# Patient Record
Sex: Male | Born: 1960 | Race: White | Hispanic: No | Marital: Married | Smoking: Never smoker
Health system: Southern US, Community
[De-identification: ages and names within clinical notes are randomized; demographics above are authoritative.]

## PROBLEM LIST (undated history)

## (undated) DIAGNOSIS — J45909 Unspecified asthma, uncomplicated: Secondary | ICD-10-CM

---

## 2020-04-16 ENCOUNTER — Emergency Department (HOSPITAL_COMMUNITY): Payer: HRSA Program

## 2020-04-16 ENCOUNTER — Telehealth (HOSPITAL_COMMUNITY): Payer: Self-pay

## 2020-04-16 ENCOUNTER — Inpatient Hospital Stay (HOSPITAL_COMMUNITY)
Admission: EM | Admit: 2020-04-16 | Discharge: 2020-04-22 | DRG: 177 | Disposition: A | Discharge status: Home / Self Care | Payer: HRSA Program | Attending: Internal Medicine | Admitting: Internal Medicine

## 2020-04-16 ENCOUNTER — Encounter (HOSPITAL_COMMUNITY): Payer: Self-pay

## 2020-04-16 DIAGNOSIS — J189 Pneumonia, unspecified organism: Secondary | ICD-10-CM | POA: Diagnosis present

## 2020-04-16 DIAGNOSIS — U071 COVID-19: Secondary | ICD-10-CM | POA: Diagnosis present

## 2020-04-16 DIAGNOSIS — D696 Thrombocytopenia, unspecified: Secondary | ICD-10-CM | POA: Diagnosis present

## 2020-04-16 DIAGNOSIS — I4510 Unspecified right bundle-branch block: Secondary | ICD-10-CM | POA: Diagnosis present

## 2020-04-16 DIAGNOSIS — J1282 Pneumonia due to coronavirus disease 2019: Secondary | ICD-10-CM | POA: Diagnosis present

## 2020-04-16 DIAGNOSIS — I10 Essential (primary) hypertension: Secondary | ICD-10-CM | POA: Diagnosis present

## 2020-04-16 DIAGNOSIS — R7982 Elevated C-reactive protein (CRP): Secondary | ICD-10-CM | POA: Diagnosis present

## 2020-04-16 DIAGNOSIS — J9601 Acute respiratory failure with hypoxia: Secondary | ICD-10-CM | POA: Diagnosis present

## 2020-04-16 DIAGNOSIS — D649 Anemia, unspecified: Secondary | ICD-10-CM | POA: Diagnosis present

## 2020-04-16 DIAGNOSIS — J45909 Unspecified asthma, uncomplicated: Secondary | ICD-10-CM | POA: Diagnosis present

## 2020-04-16 DIAGNOSIS — R0602 Shortness of breath: Secondary | ICD-10-CM

## 2020-04-16 HISTORY — DX: Unspecified asthma, uncomplicated: J45.909

## 2020-04-16 LAB — CBC
HCT: 39.3 % (ref 39.0–52.0)
Hemoglobin: 12.8 g/dL — ABNORMAL LOW (ref 13.0–17.0)
MCH: 28.3 pg (ref 26.0–34.0)
MCHC: 32.6 g/dL (ref 30.0–36.0)
MCV: 86.8 fL (ref 80.0–100.0)
Platelets: 138 10*3/uL — ABNORMAL LOW (ref 150–400)
RBC: 4.53 MIL/uL (ref 4.22–5.81)
RDW: 12.9 % (ref 11.5–15.5)
WBC: 6.9 10*3/uL (ref 4.0–10.5)
nRBC: 0 % (ref 0.0–0.2)

## 2020-04-16 LAB — FIBRINOGEN: Fibrinogen: 746 mg/dL — ABNORMAL HIGH (ref 210–475)

## 2020-04-16 LAB — BASIC METABOLIC PANEL
Anion gap: 12 (ref 5–15)
BUN: 16 mg/dL (ref 6–20)
CO2: 29 mmol/L (ref 22–32)
Calcium: 8.6 mg/dL — ABNORMAL LOW (ref 8.9–10.3)
Chloride: 98 mmol/L (ref 98–111)
Creatinine, Ser: 0.94 mg/dL (ref 0.61–1.24)
GFR, Estimated: >60 mL/min (ref >60)
Glucose, Bld: 111 mg/dL — ABNORMAL HIGH (ref 70–99)
Potassium: 3.1 mmol/L — ABNORMAL LOW (ref 3.5–5.1)
Sodium: 139 mmol/L (ref 135–145)

## 2020-04-16 LAB — D-DIMER, QUANTITATIVE: D-Dimer, Quant: 0.49 ug/mL-FEU (ref 0.00–0.50)

## 2020-04-16 LAB — POC SARS CORONAVIRUS 2 AG -  ED: SARS Coronavirus 2 Ag: NEGATIVE

## 2020-04-16 LAB — C-REACTIVE PROTEIN: CRP: 26.5 mg/dL — ABNORMAL HIGH (ref <1.0)

## 2020-04-16 LAB — FERRITIN: Ferritin: 332 ng/mL (ref 24–336)

## 2020-04-16 LAB — LACTATE DEHYDROGENASE: LDH: 333 U/L — ABNORMAL HIGH (ref 98–192)

## 2020-04-16 LAB — LACTIC ACID, PLASMA: Lactic Acid, Venous: 1.1 mmol/L (ref 0.5–1.9)

## 2020-04-16 MED ORDER — ALBUTEROL SULFATE HFA 108 (90 BASE) MCG/ACT IN AERS
2.0000 | INHALATION_SPRAY | Freq: Once | RESPIRATORY_TRACT | Status: AC
  Administered 2020-04-16: 2 via RESPIRATORY_TRACT
  Filled 2020-04-16: qty 6.7

## 2020-04-16 MED ORDER — DEXAMETHASONE SODIUM PHOSPHATE 4 MG/ML IJ SOLN
4.0000 mg | Freq: Once | INTRAMUSCULAR | Status: AC
  Administered 2020-04-16: 4 mg via INTRAVENOUS
  Filled 2020-04-16: qty 1

## 2020-04-16 NOTE — ED Triage Notes (Signed)
Pt states he was diagnosed with covid on Tuesday, vaccinated, now SOB and hypoxic, 85% on RA, placed on 3L, fevers

## 2020-04-16 NOTE — ED Notes (Signed)
Oxygen increased to 6L, oxygen 92-93%

## 2020-04-16 NOTE — Telephone Encounter (Signed)
Called to Discuss with patient about Covid symptoms and the use of the monoclonal antibody infusion for those with mild to moderate Covid symptoms and at a high risk of hospitalization.     Pt appears to qualify for this infusion due to co-morbid conditions and/or a member of an at-risk group in accordance with the FDA Emergency Use Authorization.    Pt instructed RN that I could speak to his wife as he was short of breath. Pt's wife informed RN that she and her husband both had appointments today for a monoclonal antibody infusion but her husband was turned away because his oxygen was low, she stated she did receive the infusion. She stated his oxygen was 83% and he had a history of asthma. RN informed pt's wife that he would not qualify for infusion based on his symptoms and he needed to go to the ED to be evaluated immediately. She stated that she bought an "oxygen machine for him and he was at 90L". RN informed her to take the patient to the ED for evaluation. Pt's wife understands without assistance.

## 2020-04-16 NOTE — ED Provider Notes (Signed)
[facility] EMERGENCY DEPARTMENT Provider Note   CSN: [id number] Arrival date & time: 04/16/20  1857     History Chief Complaint  Patient presents with  . Covid Positive    Erik Guerra is a 59 y.o. male history of asthma, presenting with COVID and shortness of breath and hypoxia. Patient actually received the [facility] vaccine about a month ago.  Is [place].  He states that he has been there and been exposed to multiple people during that time.  Patient states that he started coughing several days ago.  His wife and himself tested positive yesterday with Covid.  He states that he has worsening shortness of breath.  He initially went to get the monoclonal antibody but it was noted to be hypoxic at 83% and was told to come to the ED.  Patient was noted to be 85% on room air.  The history is provided by the patient.       Past Medical History:  Diagnosis Date  . Asthma     There are no problems to display for this patient.   History reviewed. No pertinent surgical history.     No family history on file.  Social History   Tobacco Use  . Smoking status: Not on file  Substance Use Topics  . Alcohol use: Not on file  . Drug use: Not on file    Home Medications Prior to Admission medications   Not on File    Allergies    Patient has no known allergies.  Review of Systems   Review of Systems  Respiratory: Positive for cough and shortness of breath.   All other systems reviewed and are negative.   Physical Exam Updated Vital Signs BP (!) 163/82 (BP Location: Right Arm)   Pulse 80   Temp 98 F (36.7 C) (Oral)   Resp 20   SpO2 92%   Physical Exam Vitals and nursing note reviewed.  Constitutional:      Comments: Tachypneic.  HENT:     Head: Normocephalic.     Nose: Nose normal.     Mouth/Throat:     Mouth: Mucous membranes are moist.  Eyes:     Extraocular Movements: Extraocular movements intact.      Pupils: Pupils are equal, round, and reactive to light.  Cardiovascular:     Rate and Rhythm: Normal rate and regular rhythm.     Pulses: Normal pulses.  Pulmonary:     Comments: Tachypneic, dry crackles bilaterally. Abdominal:     General: Abdomen is flat.     Palpations: Abdomen is soft.  Musculoskeletal:        General: Normal range of motion.     Cervical back: Normal range of motion.  Skin:    General: Skin is warm.  Neurological:     General: No focal deficit present.     Mental Status: He is oriented to person, place, and time.  Psychiatric:        Mood and Affect: Mood normal.        Behavior: Behavior normal.     ED Results / Procedures / Treatments   Labs (all labs ordered are listed, but only abnormal results are displayed) Labs Reviewed  CBC - Abnormal; Notable for the following components:      Result Value   Hemoglobin 12.8 (*)    Platelets 138 (*)    All other components within normal limits  BASIC METABOLIC PANEL -  Abnormal; Notable for the following components:   Potassium 3.1 (*)    Glucose, Bld 111 (*)    Calcium 8.6 (*)    All other components within normal limits  CULTURE, BLOOD (ROUTINE X 2)  CULTURE, BLOOD (ROUTINE X 2)  LACTIC ACID, PLASMA  LACTIC ACID, PLASMA  D-DIMER, QUANTITATIVE (NOT AT [facility])  PROCALCITONIN  LACTATE DEHYDROGENASE  FERRITIN  TRIGLYCERIDES  FIBRINOGEN  C-REACTIVE PROTEIN  POC SARS CORONAVIRUS 2 AG -  ED    EKG EKG Interpretation  Date/Time:  Wednesday April 16 2020 20:58:05 EDT Ventricular Rate:  77 PR Interval:  182 QRS Duration: 166 QT Interval:  424 QTC Calculation: 479 R Axis:   80 Text Interpretation: Normal sinus rhythm Right bundle branch block Abnormal ECG No previous ECGs available Confirmed by Richardean Canal [phone]) on 04/16/2020 9:29:14 PM   Radiology DG Chest Portable 1 View  Result Date: 04/16/2020 CLINICAL DATA:  Shortness of breath COVID positive EXAM: PORTABLE CHEST 1 VIEW COMPARISON:  None.  FINDINGS: The heart size and mediastinal contours mildly enlarged. There is diffusely increased interstitial markings seen throughout both lungs. There is patchy airspace opacity seen at the left lung base and periphery of the right lung base. No pleural effusion. IMPRESSION: Diffusely increased interstitial and patchy airspace opacities, likely consistent with multifocal pneumonia. Electronically Signed   By: Jonna Clark M.D.   On: 04/16/2020 21:36    Procedures Procedures (including critical care time)  CRITICAL CARE Performed by: Richardean Canal   Total critical care time: 30 minutes  Critical care time was exclusive of separately billable procedures and treating other patients.  Critical care was necessary to treat or prevent imminent or life-threatening deterioration.  Critical care was time spent personally by me on the following activities: development of treatment plan with patient and/or surrogate as well as nursing, discussions with consultants, evaluation of patient's response to treatment, examination of patient, obtaining history from patient or surrogate, ordering and performing treatments and interventions, ordering and review of laboratory studies, ordering and review of radiographic studies, pulse oximetry and re-evaluation of patient's condition.   Medications Ordered in ED Medications  dexamethasone (DECADRON) injection 4 mg (has no administration in time range)  albuterol (VENTOLIN HFA) 108 (90 Base) MCG/ACT inhaler 2 puff (has no administration in time range)    ED Course  I have reviewed the triage vital signs and the nursing notes.  Pertinent labs & imaging results that were available during my care of the patient were reviewed by me and considered in my medical decision making (see chart for details).    MDM Rules/Calculators/A&P                         Erik Guerra is a 59 y.o. male here with shortness of breath.  Patient was diagnosed with Covid yesterday.   Patient is hypoxic at 85%.  Since his Covid test is not in our system, I plan to repeat Covid test and get Covid preadmission labs and chest x-ray.  10:36 PM CXR showed COVID. CBC, BMP, unremarkable. COVID test pending. Will admit for hypoxia from COVID     Final Clinical Impression(s) / ED Diagnoses Final diagnoses:  None    Rx / DC Orders ED Discharge Orders    None       Charlynne Pander, MD 04/16/20 2237

## 2020-04-17 ENCOUNTER — Other Ambulatory Visit: Payer: Self-pay

## 2020-04-17 ENCOUNTER — Encounter (HOSPITAL_COMMUNITY): Payer: Self-pay | Admitting: Internal Medicine

## 2020-04-17 DIAGNOSIS — J1282 Pneumonia due to coronavirus disease 2019: Secondary | ICD-10-CM

## 2020-04-17 DIAGNOSIS — J189 Pneumonia, unspecified organism: Secondary | ICD-10-CM | POA: Diagnosis present

## 2020-04-17 DIAGNOSIS — I10 Essential (primary) hypertension: Secondary | ICD-10-CM

## 2020-04-17 DIAGNOSIS — U071 COVID-19: Principal | ICD-10-CM

## 2020-04-17 LAB — FERRITIN: Ferritin: 365 ng/mL — ABNORMAL HIGH (ref 24–336)

## 2020-04-17 LAB — BASIC METABOLIC PANEL
Anion gap: 14 (ref 5–15)
BUN: 12 mg/dL (ref 6–20)
CO2: 28 mmol/L (ref 22–32)
Calcium: 8.5 mg/dL — ABNORMAL LOW (ref 8.9–10.3)
Chloride: 98 mmol/L (ref 98–111)
Creatinine, Ser: 0.75 mg/dL (ref 0.61–1.24)
GFR, Estimated: >60 mL/min (ref >60)
Glucose, Bld: 146 mg/dL — ABNORMAL HIGH (ref 70–99)
Potassium: 3.7 mmol/L (ref 3.5–5.1)
Sodium: 140 mmol/L (ref 135–145)

## 2020-04-17 LAB — MAGNESIUM: Magnesium: 2.3 mg/dL (ref 1.7–2.4)

## 2020-04-17 LAB — C-REACTIVE PROTEIN: CRP: 29.9 mg/dL — ABNORMAL HIGH (ref <1.0)

## 2020-04-17 LAB — IRON AND TIBC
Iron: 16 ug/dL — ABNORMAL LOW (ref 45–182)
Saturation Ratios: 6 % — ABNORMAL LOW (ref 17.9–39.5)
TIBC: 269 ug/dL (ref 250–450)
UIBC: 253 ug/dL

## 2020-04-17 LAB — CBC
HCT: 38.7 % — ABNORMAL LOW (ref 39.0–52.0)
Hemoglobin: 12.8 g/dL — ABNORMAL LOW (ref 13.0–17.0)
MCH: 28.8 pg (ref 26.0–34.0)
MCHC: 33.1 g/dL (ref 30.0–36.0)
MCV: 87.2 fL (ref 80.0–100.0)
Platelets: 147 10*3/uL — ABNORMAL LOW (ref 150–400)
RBC: 4.44 MIL/uL (ref 4.22–5.81)
RDW: 13 % (ref 11.5–15.5)
WBC: 6.7 10*3/uL (ref 4.0–10.5)
nRBC: 0 % (ref 0.0–0.2)

## 2020-04-17 LAB — RESPIRATORY PANEL BY RT PCR (FLU A&B, COVID)
Influenza A by PCR: NEGATIVE
Influenza B by PCR: NEGATIVE
SARS Coronavirus 2 by RT PCR: POSITIVE — AB

## 2020-04-17 LAB — VITAMIN B12: Vitamin B-12: 809 pg/mL (ref 180–914)

## 2020-04-17 LAB — HIV ANTIBODY (ROUTINE TESTING W REFLEX): HIV Screen 4th Generation wRfx: NONREACTIVE

## 2020-04-17 LAB — CREATININE, SERUM
Creatinine, Ser: 0.78 mg/dL (ref 0.61–1.24)
GFR, Estimated: >60 mL/min (ref >60)

## 2020-04-17 LAB — LACTIC ACID, PLASMA: Lactic Acid, Venous: 1 mmol/L (ref 0.5–1.9)

## 2020-04-17 LAB — PROCALCITONIN: Procalcitonin: 0.13 ng/mL

## 2020-04-17 LAB — FOLATE: Folate: 6.7 ng/mL (ref >5.9)

## 2020-04-17 LAB — RETICULOCYTES
Immature Retic Fract: 7.2 % (ref 2.3–15.9)
RBC.: 4.57 MIL/uL (ref 4.22–5.81)
Retic Count, Absolute: 22.4 10*3/uL (ref 19.0–186.0)
Retic Ct Pct: 0.5 % (ref 0.4–3.1)

## 2020-04-17 LAB — D-DIMER, QUANTITATIVE: D-Dimer, Quant: 0.61 ug/mL-FEU — ABNORMAL HIGH (ref 0.00–0.50)

## 2020-04-17 LAB — STREP PNEUMONIAE URINARY ANTIGEN: Strep Pneumo Urinary Antigen: NEGATIVE

## 2020-04-17 LAB — BRAIN NATRIURETIC PEPTIDE: B Natriuretic Peptide: 62.6 pg/mL (ref 0.0–100.0)

## 2020-04-17 LAB — TRIGLYCERIDES: Triglycerides: 81 mg/dL (ref <150)

## 2020-04-17 MED ORDER — HYDROCOD POLST-CPM POLST ER 10-8 MG/5ML PO SUER
5.0000 mL | Freq: Two times a day (BID) | ORAL | Status: DC | PRN
  Filled 2020-04-17: qty 5

## 2020-04-17 MED ORDER — TOCILIZUMAB 400 MG/20ML IV SOLN
800.0000 mg | Freq: Once | INTRAVENOUS | Status: AC
  Administered 2020-04-17: 800 mg via INTRAVENOUS
  Filled 2020-04-17: qty 40

## 2020-04-17 MED ORDER — SODIUM CHLORIDE 0.9 % IV SOLN
500.0000 mg | INTRAVENOUS | Status: DC
  Administered 2020-04-17: 500 mg via INTRAVENOUS
  Filled 2020-04-17: qty 500

## 2020-04-17 MED ORDER — POTASSIUM CHLORIDE CRYS ER 20 MEQ PO TBCR
40.0000 meq | EXTENDED_RELEASE_TABLET | Freq: Once | ORAL | Status: AC
  Administered 2020-04-17: 40 meq via ORAL
  Filled 2020-04-17: qty 2

## 2020-04-17 MED ORDER — SODIUM CHLORIDE 0.9 % IV SOLN
2.0000 g | Freq: Once | INTRAVENOUS | Status: AC
  Administered 2020-04-17: 2 g via INTRAVENOUS
  Filled 2020-04-17: qty 20

## 2020-04-17 MED ORDER — ACETAMINOPHEN 325 MG PO TABS
650.0000 mg | ORAL_TABLET | Freq: Four times a day (QID) | ORAL | Status: DC | PRN
  Administered 2020-04-17 – 2020-04-19 (×6): 650 mg via ORAL
  Filled 2020-04-17 (×7): qty 2

## 2020-04-17 MED ORDER — PNEUMOCOCCAL VAC POLYVALENT 25 MCG/0.5ML IJ INJ
0.5000 mL | INJECTION | INTRAMUSCULAR | Status: DC
  Filled 2020-04-17: qty 0.5

## 2020-04-17 MED ORDER — ALBUTEROL SULFATE HFA 108 (90 BASE) MCG/ACT IN AERS
2.0000 | INHALATION_SPRAY | RESPIRATORY_TRACT | Status: DC
  Administered 2020-04-17 – 2020-04-20 (×19): 2 via RESPIRATORY_TRACT
  Filled 2020-04-17: qty 6.7

## 2020-04-17 MED ORDER — HYDRALAZINE HCL 20 MG/ML IJ SOLN
10.0000 mg | INTRAMUSCULAR | Status: DC | PRN
  Administered 2020-04-18 – 2020-04-19 (×2): 10 mg via INTRAVENOUS
  Filled 2020-04-17 (×2): qty 1

## 2020-04-17 MED ORDER — MELATONIN 5 MG PO TABS
5.0000 mg | ORAL_TABLET | Freq: Every evening | ORAL | Status: DC | PRN
  Administered 2020-04-17 – 2020-04-21 (×4): 5 mg via ORAL
  Filled 2020-04-17 (×8): qty 1

## 2020-04-17 MED ORDER — GUAIFENESIN-DM 100-10 MG/5ML PO SYRP
10.0000 mL | ORAL_SOLUTION | ORAL | Status: DC | PRN
  Administered 2020-04-17: 10 mL via ORAL
  Filled 2020-04-17: qty 10

## 2020-04-17 MED ORDER — SODIUM CHLORIDE 0.9 % IV SOLN: 2.0000 g | INTRAVENOUS | Status: DC

## 2020-04-17 MED ORDER — ENOXAPARIN SODIUM 40 MG/0.4ML ~~LOC~~ SOLN
40.0000 mg | SUBCUTANEOUS | Status: DC
  Administered 2020-04-17: 40 mg via SUBCUTANEOUS
  Filled 2020-04-17: qty 0.4

## 2020-04-17 MED ORDER — METHYLPREDNISOLONE SODIUM SUCC 40 MG IJ SOLR: 40.0000 mg | Freq: Two times a day (BID) | INTRAMUSCULAR | Status: DC

## 2020-04-17 MED ORDER — METHYLPREDNISOLONE SODIUM SUCC 125 MG IJ SOLR
60.0000 mg | Freq: Two times a day (BID) | INTRAMUSCULAR | Status: DC
  Administered 2020-04-17 – 2020-04-21 (×8): 60 mg via INTRAVENOUS
  Filled 2020-04-17 (×8): qty 2

## 2020-04-17 MED ORDER — SODIUM CHLORIDE 0.9 % IV SOLN
100.0000 mg | Freq: Every day | INTRAVENOUS | Status: AC
  Administered 2020-04-18 – 2020-04-21 (×4): 100 mg via INTRAVENOUS
  Filled 2020-04-17 (×4): qty 20

## 2020-04-17 MED ORDER — SODIUM CHLORIDE 0.9 % IV SOLN
200.0000 mg | Freq: Once | INTRAVENOUS | Status: AC
  Administered 2020-04-17: 200 mg via INTRAVENOUS
  Filled 2020-04-17: qty 40

## 2020-04-17 MED ORDER — ASCORBIC ACID 500 MG PO TABS
500.0000 mg | ORAL_TABLET | Freq: Every day | ORAL | Status: DC
  Administered 2020-04-17 – 2020-04-22 (×6): 500 mg via ORAL
  Filled 2020-04-17 (×6): qty 1

## 2020-04-17 MED ORDER — AMLODIPINE BESYLATE 10 MG PO TABS
10.0000 mg | ORAL_TABLET | Freq: Every day | ORAL | Status: DC
  Administered 2020-04-17 – 2020-04-22 (×6): 10 mg via ORAL
  Filled 2020-04-17 (×6): qty 1

## 2020-04-17 MED ORDER — ACETAMINOPHEN 650 MG RE SUPP: 650.0000 mg | Freq: Four times a day (QID) | RECTAL | Status: DC | PRN

## 2020-04-17 MED ORDER — ZINC SULFATE 220 (50 ZN) MG PO CAPS
220.0000 mg | ORAL_CAPSULE | Freq: Every day | ORAL | Status: DC
  Administered 2020-04-17 – 2020-04-22 (×6): 220 mg via ORAL
  Filled 2020-04-17 (×6): qty 1

## 2020-04-17 NOTE — ED Notes (Signed)
Patient transported to floor.

## 2020-04-17 NOTE — Progress Notes (Signed)
PROGRESS NOTE                                                                                                                                                                                                             Patient Demographics:    Erik Guerra, is a 59 y.o. male, DOB - 02/11/1961, IZT:[id number]  Outpatient Primary MD for the patient is Patient, No Pcp Per    LOS - 1  Admit date - 04/16/2020    Chief Complaint  Patient presents with  . Covid Positive       Brief Narrative (HPI from H&P)  - Erik Guerra is a 59 y.o. male with history of asthma presently not on any medication presents to the ER because of shortness of breath.  Patient states he started developing symptoms of fatigue weakness and fever and chills about 10 days ago when he attended a fair, he was diagnosed with acute hypoxic respiratory failure due to COVID-19 pneumonia and admitted to the hospital.  Unfortunately he is not vaccinated for COVID-19.   Subjective:    Erik Guerra today has, No headache, No chest pain, No abdominal pain - No Nausea, No new weakness tingling or numbness, ++ SOB.   Assessment  & Plan :      1. Acute Hypoxic Resp. Failure due to Acute Covid 19 Viral Pneumonitis during the ongoing 2020 Covid 19 Pandemic - he is unfortunately unvaccinated and has incurred moderate to severe parenchymal lung injury, he is already on steroids and Remdesivir but getting more hypoxic despite that, will continue with Actemra administration.  He has consented for Actemra use.  Stop antibiotics as I do not suspect a bacterial component to his infection, no productive cough, no leukocytosis and no focal infiltrate.  Encouraged the patient to sit up in chair in the daytime use I-S and flutter valve for pulmonary toiletry and then prone in bed when at night.  Will advance activity and titrate down oxygen as possible.  Actemra/Baricitinib   off label use - patient was told that if COVID-19 pneumonitis gets worse we might potentially use Actemra off label, patient denies any known history of active diverticulitis, tuberculosis or hepatitis, understands the risks and benefits and wants to proceed with Actemra treatment if required.     SpO2: 92 % O2 Flow Rate (L/min): 5 L/min  Recent Labs  Lab 04/16/20 2052 04/16/20 2247 04/16/20 2248 04/17/20 0212 04/17/20 0303 04/17/20 0314  WBC 6.9  --   --  6.7  --   --   HGB 12.8*  --   --  12.8*  --   --   HCT 39.3  --   --  38.7*  --   --   PLT 138*  --   --  147*  --   --   CRP  --   --  26.5*  --   --   --   DDIMER  --   --  0.49  --   --   --   PROCALCITON  --   --  0.13  --   --   --   LATICACIDVEN  --  1.1  --   --   --  1.0  SARSCOV2NAA  --   --   --   --  POSITIVE*  --      2.  Undiagnosed hypertension.  Add Norvasc along with as needed hydralazine.  3. Mild anemia and thrombocytopenia.  Could be due to viral infection.  PCP to monitor post discharge and do age-appropriate outpatient work-up if needed.     Condition - Extremely Guarded  Family Communication  :  Wife Elita Quickam [phone] on 04/17/20 @ 10/24 am - message left  Code Status : Full  Consults  : None  Procedures  :  None  PUD Prophylaxis : None  Disposition Plan  :    Status is: Inpatient  Remains inpatient appropriate because:IV treatments appropriate due to intensity of illness or inability to take PO   Dispo: The patient is from: Home              Anticipated d/c is to: Home              Anticipated d/c date is: > 3 days              Patient currently is not medically stable to d/c.   DVT Prophylaxis  :  Lovenox    Lab Results  Component Value Date   PLT 147 (L) 04/17/2020    Diet :  Diet Order            Diet regular Room service appropriate? Yes; Fluid consistency: Thin  Diet effective now                  Inpatient Medications  Scheduled Meds: . albuterol  2 puff  Inhalation Q4H  . vitamin C  500 mg Oral Daily  . enoxaparin (LOVENOX) injection  40 mg Subcutaneous Q24H  . methylPREDNISolone (SOLU-MEDROL) injection  60 mg Intravenous Q12H  . zinc sulfate  220 mg Oral Daily   Continuous Infusions: . azithromycin Stopped (04/17/20 0559)  . [START ON 04/18/2020] cefTRIAXone (ROCEPHIN)  IV    . [START ON 04/18/2020] remdesivir 100 mg in NS 100 mL    . tocilizumab (ACTEMRA) - non-COVID treatment     PRN Meds:.acetaminophen **OR** [DISCONTINUED] acetaminophen, chlorpheniramine-HYDROcodone, guaiFENesin-dextromethorphan, hydrALAZINE  Antibiotics  :    Anti-infectives (From admission, onward)   Start     Dose/Rate Route Frequency Ordered Stop   04/18/20 1000  remdesivir 100 mg in sodium chloride 0.9 % 100 mL IVPB       "Followed by" Linked Group Details   100 mg 200 mL/hr over 30 Minutes Intravenous Daily 04/17/20 0705 04/22/20 0959   04/18/20 0000  cefTRIAXone (ROCEPHIN) 2  g in sodium chloride 0.9 % 100 mL IVPB        2 g 200 mL/hr over 30 Minutes Intravenous Every 24 hours 04/17/20 0214 04/21/20 2359   04/17/20 0800  remdesivir 200 mg in sodium chloride 0.9% 250 mL IVPB       "Followed by" Linked Group Details   200 mg 580 mL/hr over 30 Minutes Intravenous Once 04/17/20 0705 04/17/20 0932   04/17/20 0300  azithromycin (ZITHROMAX) 500 mg in sodium chloride 0.9 % 250 mL IVPB        500 mg 250 mL/hr over 60 Minutes Intravenous Every 24 hours 04/17/20 0214 04/22/20 0259   04/17/20 0230  cefTRIAXone (ROCEPHIN) 2 g in sodium chloride 0.9 % 100 mL IVPB        2 g 200 mL/hr over 30 Minutes Intravenous  Once 04/17/20 0219 04/17/20 0332       Time Spent in minutes  30   Susa Raring M.D on 04/17/2020 at 10:22 AM  To page go to [email or web address] - password [facility]  [facility] -  Office  [phone]   See all Orders from today for further details    Objective:   Vitals:   04/17/20 0715 04/17/20 0730 04/17/20 0745 04/17/20 0807  BP: 129/79  (!) 151/82 (!) 143/88 (!) 162/84  Pulse: 66 70 64 68  Resp:    19  Temp:    98.2 F (36.8 C)  TempSrc:    Oral  SpO2: 92% 94% 93% 92%    Wt Readings from Last 3 Encounters:  No data found for Wt     Intake/Output Summary (Last 24 hours) at 04/17/2020 1022 Last data filed at 04/17/2020 0559 Gross per 24 hour  Intake 350 ml  Output --  Net 350 ml     Physical Exam  Awake Alert, No new F.N deficits, Normal affect [place].AT,PERRAL Supple Neck,No JVD, No cervical lymphadenopathy appriciated.  Symmetrical Chest wall movement, Good air movement bilaterally, CTAB RRR,No Gallops,Rubs or new Murmurs, No Parasternal Heave +ve B.Sounds, Abd Soft, No tenderness, No organomegaly appriciated, No rebound - guarding or rigidity. No Cyanosis, Clubbing or edema, No new Rash or bruise     Data Review:    CBC Recent Labs  Lab 04/16/20 2052 04/17/20 0212  WBC 6.9 6.7  HGB 12.8* 12.8*  HCT 39.3 38.7*  PLT 138* 147*  MCV 86.8 87.2  MCH 28.3 28.8  MCHC 32.6 33.1  RDW 12.9 13.0    Recent Labs  Lab 04/16/20 2052 04/16/20 2247 04/16/20 2248 04/17/20 0212 04/17/20 0314  NA 139  --   --   --   --   K 3.1*  --   --   --   --   CL 98  --   --   --   --   CO2 29  --   --   --   --   GLUCOSE 111*  --   --   --   --   BUN 16  --   --   --   --   CREATININE 0.94  --   --  0.78  --   CALCIUM 8.6*  --   --   --   --   CRP  --   --  26.5*  --   --   DDIMER  --   --  0.49  --   --   PROCALCITON  --   --  0.13  --   --  LATICACIDVEN  --  1.1  --   --  1.0    ------------------------------------------------------------------------------------------------------------------ Recent Labs    04/16/20 2248  TRIG 81    No results found for: HGBA1C ------------------------------------------------------------------------------------------------------------------ No results for input(s): TSH, T4TOTAL, T3FREE, THYROIDAB in the last 72 hours.  Invalid input(s): FREET3  Cardiac Enzymes No  results for input(s): CKMB, TROPONINI, MYOGLOBIN in the last 168 hours.  Invalid input(s): CK ------------------------------------------------------------------------------------------------------------------ No results found for: BNP  Micro Results Recent Results (from the past 240 hour(s))  Blood Culture (routine x 2)     Status: None (Preliminary result)   Collection Time: 04/16/20  9:41 PM   Specimen: BLOOD  Result Value Ref Range Status   Specimen Description BLOOD SITE NOT SPECIFIED  Final   Special Requests   Final    BOTTLES DRAWN AEROBIC AND ANAEROBIC Blood Culture results may not be optimal due to an excessive volume of blood received in culture bottles   Culture   Final    NO GROWTH < 12 HOURS Performed at [facility], [address], [place], [place] [address]    Report Status PENDING  Incomplete  Respiratory Panel by RT PCR (Flu A&B, Covid) - Nasopharyngeal Swab     Status: Abnormal   Collection Time: 04/17/20  3:03 AM   Specimen: Nasopharyngeal Swab  Result Value Ref Range Status   SARS Coronavirus 2 by RT PCR POSITIVE (A) NEGATIVE Final    Comment: RESULT CALLED TO, READ BACK BY AND VERIFIED WITH: Conception Chancy RN 04/17/20 0538 JDW (NOTE) SARS-CoV-2 target nucleic acids are DETECTED.  SARS-CoV-2 RNA is generally detectable in upper respiratory specimens  during the acute phase of infection. Positive results are indicative of the presence of the identified virus, but do not rule out bacterial infection or co-infection with other pathogens not detected by the test. Clinical correlation with patient history and other diagnostic information is necessary to determine patient infection status. The expected result is Negative.  Fact Sheet for Patients:  https://www.moore.com/  Fact Sheet for Healthcare Providers: https://www.young.biz/  This test is not yet approved or cleared by the [place] FDA and  has been  authorized for detection and/or diagnosis of SARS-CoV-2 by FDA under an Emergency Use Authorization (EUA).  This EUA will remain in effect (meaning this test can be [place] ed) for the duration of  the COVID-19 declaration under Section 564(b)(1) of the Act, 21 U.S.C. section 360bbb-3(b)(1), unless the authorization is terminated or revoked sooner.      Influenza A by PCR NEGATIVE NEGATIVE Final   Influenza B by PCR NEGATIVE NEGATIVE Final    Comment: (NOTE) The Xpert Xpress SARS-CoV-2/FLU/RSV assay is intended as an aid in  the diagnosis of influenza from Nasopharyngeal swab specimens and  should not be used as a sole basis for treatment. Nasal washings and  aspirates are unacceptable for Xpert Xpress SARS-CoV-2/FLU/RSV  testing.  Fact Sheet for Patients: https://www.moore.com/  Fact Sheet for Healthcare Providers: https://www.young.biz/  This test is not yet approved or cleared by the [place] FDA and  has been authorized for detection and/or diagnosis of SARS-CoV-2 by  FDA under an Emergency Use Authorization (EUA). This EUA will remain  in effect (meaning this test can be used) for the duration of the  Covid-19 declaration under Section 564(b)(1) of the Act, 21  U.S.C. section 360bbb-3(b)(1), unless the authorization is  terminated or revoked. Performed at [facility], [address], [place], [place] [address]  Radiology Reports DG Chest Portable 1 View  Result Date: 04/16/2020 CLINICAL DATA:  Shortness of breath COVID positive EXAM: PORTABLE CHEST 1 VIEW COMPARISON:  None. FINDINGS: The heart size and mediastinal contours mildly enlarged. There is diffusely increased interstitial markings seen throughout both lungs. There is patchy airspace opacity seen at the left lung base and periphery of the right lung base. No pleural effusion. IMPRESSION: Diffusely increased interstitial and patchy airspace opacities, likely  consistent with multifocal pneumonia. Electronically Signed   By: Jonna Clark M.D.   On: 04/16/2020 21:36

## 2020-04-17 NOTE — Plan of Care (Signed)
  Problem: Education: Goal: Knowledge of General Education information will improve Description: Including pain rating scale, medication(s)/side effects and non-pharmacologic comfort measures Outcome: Progressing   Problem: Health Behavior/Discharge Planning: Goal: Ability to manage health-related needs will improve Outcome: Progressing   Problem: Health Behavior/Discharge Planning: Goal: Ability to manage health-related needs will improve Outcome: Progressing   Problem: Clinical Measurements: Goal: Ability to maintain clinical measurements within normal limits will improve Outcome: Progressing Goal: Will remain free from infection Outcome: Progressing Goal: Diagnostic test results will improve Outcome: Progressing Goal: Respiratory complications will improve Outcome: Not Progressing Note: No progress being made towards goals; patient's O2 needs increased during shift.  Patient currently on 7 L. Goal: Cardiovascular complication will be avoided Outcome: Progressing   Problem: Activity: Goal: Risk for activity intolerance will decrease Outcome: Not Progressing Note: Exercise intolerance noted, patient destats when performing bed mobility.   Problem: Nutrition: Goal: Adequate nutrition will be maintained Outcome: Progressing   Problem: Coping: Goal: Level of anxiety will decrease Outcome: Progressing   Problem: Elimination: Goal: Will not experience complications related to bowel motility Outcome: Not Progressing Note: Patient had loose stool during shift. Goal: Will not experience complications related to urinary retention Outcome: Progressing   Problem: Pain Managment: Goal: General experience of comfort will improve Outcome: Progressing   Problem: Safety: Goal: Ability to remain free from injury will improve Outcome: Progressing   Problem: Skin Integrity: Goal: Risk for impaired skin integrity will decrease Outcome: Progressing

## 2020-04-17 NOTE — Progress Notes (Signed)
Patient admitted from ED. He is alert and oriented x4, ID band was verified with the patient. The patients vitals are stable and he is resting comfortably in bed. The call bell and bedside table are with in reach and patients belongings (glasses, cell phone, clothing) are at the bedside.

## 2020-04-17 NOTE — Progress Notes (Signed)
   04/17/20 1957  Incentive Spirometry  Incentive Spirometry Goal (mL) (RN or RT) 1500 mL  Incentive Spirometry - Achieved (mL) (RN, NT, or RT) 1000 mL  Incentive Spirometry - # of Times (RN or NT) 16  Incentive Spirometry Effort (RN) Needs reinforcement

## 2020-04-17 NOTE — H&P (Signed)
History and Physical    Erik Guerra WUX:[id number] DOB: 1960/11/30 DOA: 04/16/2020  PCP: Patient, No Pcp Per  Patient coming from: Home.  Chief Complaint: Shortness of breath.  HPI: Erik Guerra is a 59 y.o. male with history of asthma presently not on any medication presents to the ER because of shortness of breath.  Patient states he started developing symptoms of fatigue weakness and fever and chills about 10 days ago when he attended a fair.  About 4 days ago he started having shortness of breath and had gone to an urgent care in [place] was tested positive for Covid.  Since he became more short of breath he decided to come to the ER.  Denies any chest pain has been having productive cough which is yellowish in color.  ED Course: In the ER patient was hypoxic and was saturating around 85% on room air requiring 3 L oxygen to maintain sats.  Chest x-ray shows bilateral infiltrates.  However Covid test came negative in the ER.  We do not have any documented Covid test at this time.  Patient was placed on Decadron by the ER physician.  Labs are significant for potassium of 3.1 hemoglobin 12.8 platelets of 138.  CRP 26.5 procalcitonin 0.13 lactic acid was normal.  D-dimer was 0.49 EKG shows normal sinus rhythm with RBBB.  Review of Systems: As per HPI, rest all negative.   Past Medical History:  Diagnosis Date  . Asthma     History reviewed. No pertinent surgical history.   reports that he has never smoked. He has never used smokeless tobacco. No history on file for alcohol use and drug use.  No Known Allergies  Family History  Problem Relation Age of Onset  . Diabetes Mellitus II Neg Hx     Prior to Admission medications   Medication Sig Start Date End Date Taking? Authorizing Provider  Buprenorphine HCl-Naloxone HCl 8-2 MG FILM Place 2.5 mg under the tongue daily. 04/07/20  Yes [provider]    Physical Exam: Constitutional: Moderately built and  nourished. Vitals:   04/16/20 2042 04/16/20 2100 04/16/20 2215 04/16/20 2330  BP: (!) 163/82  (!) 165/84 (!) 153/82  Pulse: 80  72 73  Resp: 20  19 (!) 22  Temp: 98 F (36.7 C)     TempSrc: Oral     SpO2: 93% 92% 97% 95%   Eyes: Anicteric no pallor. ENMT: No discharge from the ears eyes nose or mouth. Neck: No mass felt.  No neck rigidity.  No JVD appreciated. Respiratory: Mild bilateral expiratory wheeze and no crepitations.  Cardiovascular: S1-S2 heard. Abdomen: Soft nontender bowel sounds present. Musculoskeletal: No edema. Skin: No rash. Neurologic: Alert awake oriented to time place and person.  Moves all extremities. Psychiatric: Appears normal.  Normal affect.   Labs on Admission: I have personally reviewed following labs and imaging studies  CBC: Recent Labs  Lab 04/16/20 2052  WBC 6.9  HGB 12.8*  HCT 39.3  MCV 86.8  PLT 138*   Basic Metabolic Panel: Recent Labs  Lab 04/16/20 2052  NA 139  K 3.1*  CL 98  CO2 29  GLUCOSE 111*  BUN 16  CREATININE 0.94  CALCIUM 8.6*   GFR: CrCl cannot be calculated (Unknown ideal weight.). Liver Function Tests: No results for input(s): AST, ALT, ALKPHOS, BILITOT, PROT, ALBUMIN in the last 168 hours. No results for input(s): LIPASE, AMYLASE in the last 168 hours. No results for input(s): AMMONIA in the last 168  hours. Coagulation Profile: No results for input(s): INR, PROTIME in the last 168 hours. Cardiac Enzymes: No results for input(s): CKTOTAL, CKMB, CKMBINDEX, TROPONINI in the last 168 hours. BNP (last 3 results) No results for input(s): PROBNP in the last 8760 hours. HbA1C: No results for input(s): HGBA1C in the last 72 hours. CBG: No results for input(s): GLUCAP in the last 168 hours. Lipid Profile: Recent Labs    04/16/20 2248  TRIG 81   Thyroid Function Tests: No results for input(s): TSH, T4TOTAL, FREET4, T3FREE, THYROIDAB in the last 72 hours. Anemia Panel: Recent Labs    04/16/20 2248    FERRITIN 332   Urine analysis: No results found for: COLORURINE, APPEARANCEUR, LABSPEC, PHURINE, GLUCOSEU, HGBUR, BILIRUBINUR, KETONESUR, PROTEINUR, UROBILINOGEN, NITRITE, LEUKOCYTESUR Sepsis Labs: @LABRCNTIP (procalcitonin:4,lacticidven:4) )No results found for this or any previous visit (from the past 240 hour(s)).   Radiological Exams on Admission: DG Chest Portable 1 View  Result Date: 04/16/2020 CLINICAL DATA:  Shortness of breath COVID positive EXAM: PORTABLE CHEST 1 VIEW COMPARISON:  None. FINDINGS: The heart size and mediastinal contours mildly enlarged. There is diffusely increased interstitial markings seen throughout both lungs. There is patchy airspace opacity seen at the left lung base and periphery of the right lung base. No pleural effusion. IMPRESSION: Diffusely increased interstitial and patchy airspace opacities, likely consistent with multifocal pneumonia. Electronically Signed   By: 04/18/2020 M.D.   On: 04/16/2020 21:36    EKG: Independently reviewed.  Normal sinus rhythm RBBB.  Assessment/Plan Principal Problem:   CAP (community acquired pneumonia) Active Problems:   Pneumonia    1. Acute hypoxic respiratory failure presently on 3 L oxygen was satting around 85% on room air.  Likely from pneumonia patient states he was positive for COVID-19 infection test done 3 days ago in hospital.  Patient will be bringing the coffee in the morning.  We will also have to try to contact the urgent care center in the morning.  Initial Covid test in the ER was negative I have ordered a repeat one.  Given the patient has discolored sputum with mildly elevated procalcitonin 0.13 I have placed patient on empiric antibiotics for community-acquired pneumonia.  Since patient is also mildly wheezing I have placed patient on Solu-Medrol.  If patient brings his Covid test results or if patient turns out to be Covid positive in our hospital will need to start patient on remdesivir.  And if  there is any further worsening of hypoxia will need baricitinib which patient has consented. 2. History of asthma mildly wheezing at this time.  Patient on Solu-Medrol and albuterol HFA. 3. Elevated blood pressure for which we will keep patient on as needed IV hydralazine and follow blood pressure trends. 4. Normocytic normochromic anemia follow CBC.  Check anemia panel. 5. Thrombocytopenia could be from infectious process follow CBC.   The patient has acute respiratory failure with hypoxia and pneumonia will need close monitoring for any further worsening in inpatient status.   DVT prophylaxis: Lovenox. Code Status: Full code. Family Communication: Discussed with patient. Disposition Plan: Home. Consults called: None. Admission status: Inpatient.   04/18/2020 MD [facility] Pager [phone].  If 7PM-7AM, please contact night-coverage [email or web address] Password [email or web address]  04/17/2020, 2:15 AM

## 2020-04-17 NOTE — Evaluation (Signed)
Physical Therapy Evaluation Patient Details Name: Erik Guerra MRN: [id number] DOB: 10-Nov-1960 Today's Date: 04/17/2020   History of Present Illness  59yo male found to be covid positive after attending the fair. PMH asthma  Clinical Impression  Patient received in bed, pleasant but very anxious regarding his Covid diagnosis, willing to attempt activity. Able to mobilize on a S-min guard level, however with even low level standing activities quickly desatted to 80% while on 7LPM per HFNC; also rapidly had significant increase in RR/WOB and we quickly had to return to supine to recover. Very anxious today, asked me multiple times if he was going to die on this unit, max emotional support and encouragement provided. Left in bed positioned to comfort with HR WNL, RR back to 20-25, and Spo2 at 88-90% on 7LPM HFNC, nursing staff aware of patient status.     Follow Up Recommendations Home health PT    Equipment Recommendations  None recommended by PT    Recommendations for Other Services       Precautions / Restrictions Precautions Precautions: Fall;Other (comment) Precaution Comments: covid +, very anxious Restrictions Weight Bearing Restrictions: No      Mobility  Bed Mobility Overal bed mobility: Needs Assistance Bed Mobility: Supine to Sit;Sit to Supine     Supine to sit: Supervision Sit to supine: Supervision   General bed mobility comments: S for safety, line management    Transfers Overall transfer level: Needs assistance Equipment used: None Transfers: Sit to/from Stand Sit to Stand: Min guard         General transfer comment: min guard for safety, assist for line management  Ambulation/Gait         Gait velocity: decrease   General Gait Details: DNT due to significant increase in RR/WOB with standing/taking steps beside bed, able to take some forward/backward steps beside bed but desatted to 80% on 7LPM per HFNC  Stairs            Wheelchair  Mobility    Modified Rankin (Stroke Patients Only)       Balance Overall balance assessment: Mild deficits observed, not formally tested                                           Pertinent Vitals/Pain Pain Assessment: 0-10 Pain Score: 8  Pain Location: headache Pain Descriptors / Indicators: Heaviness;Headache Pain Intervention(s): Limited activity within patient's tolerance;Monitored during session;Premedicated before session;Repositioned    Home Living Family/patient expects to be discharged to:: Private residence Living Arrangements: Alone;Spouse/significant other Available Help at Discharge: Family;Available PRN/intermittently Type of Home: House Home Access: Stairs to enter Entrance Stairs-Rails: None Entrance Stairs-Number of Steps: 2 Home Layout: One level Home Equipment: None      Prior Function Level of Independence: Independent         Comments: works at [employer] with his wife- "lots of work", 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     Hand Dominance        Extremity/Trunk Assessment   Upper Extremity Assessment Upper Extremity Assessment: Defer to OT evaluation    Lower Extremity Assessment Lower Extremity Assessment: Overall WFL for tasks assessed    Cervical / Trunk Assessment Cervical / Trunk Assessment: Normal  Communication   Communication: No difficulties  Cognition Arousal/Alertness: Awake/alert Behavior During Therapy: WFL for tasks assessed/performed;Anxious Overall  Cognitive Status: Within Functional Limits for tasks assessed                                 General Comments: cooperative and pleasant but extremely anxious- asked me multiple times if he was going to die in here and how long patients are typically admitted for covid      General Comments General comments (skin integrity, edema, etc.): on 7LPM per HFNC; desat to 80% and RR up to 38 with  standing activities, very anxious. HR WNL.    Exercises     Assessment/Plan    PT Assessment Patient needs continued PT services  PT Problem List Decreased knowledge of use of DME;Decreased activity tolerance;Decreased safety awareness;Decreased balance;Decreased mobility;Cardiopulmonary status limiting activity;Decreased knowledge of precautions;Decreased coordination       PT Treatment Interventions DME instruction;Balance training;Gait training;Stair training;Functional mobility training;Patient/family education;Therapeutic activities;Therapeutic exercise    PT Goals (Current goals can be found in the Care Plan section)  Acute Rehab PT Goals Patient Stated Goal: get well and off of this unit PT Goal Formulation: With patient Time For Goal Achievement: 05/01/20 Potential to Achieve Goals: Fair    Frequency Min 3X/week   Barriers to discharge        Co-evaluation               AM-PAC PT "6 Clicks" Mobility  Outcome Measure Help needed turning from your back to your side while in a flat bed without using bedrails?: None Help needed moving from lying on your back to sitting on the side of a flat bed without using bedrails?: None Help needed moving to and from a bed to a chair (including a wheelchair)?: A Little Help needed standing up from a chair using your arms (e.g., wheelchair or bedside chair)?: A Little Help needed to walk in hospital room?: A Little Help needed climbing 3-5 steps with a railing? : A Little 6 Click Score: 20    End of Session Equipment Utilized During Treatment: Oxygen Activity Tolerance: Other (comment);Patient tolerated treatment well (anxiety) Patient left: in bed;with call bell/phone within reach Nurse Communication: Mobility status PT Visit Diagnosis: Unsteadiness on feet (R26.81);Difficulty in walking, not elsewhere classified (R26.2)    Time: 1510-1530 PT Time Calculation (min) (ACUTE ONLY): 20 min   Charges:   PT Evaluation $PT  Eval Moderate Complexity: 1 Mod          Madelaine Etienne, DPT, PN1   Supplemental Physical Therapist [facility]    Pager [phone] Acute Rehab Office [phone]

## 2020-04-18 DIAGNOSIS — J189 Pneumonia, unspecified organism: Secondary | ICD-10-CM

## 2020-04-18 LAB — COMPREHENSIVE METABOLIC PANEL
ALT: 19 U/L (ref 0–44)
AST: 25 U/L (ref 15–41)
Albumin: 2.8 g/dL — ABNORMAL LOW (ref 3.5–5.0)
Alkaline Phosphatase: 44 U/L (ref 38–126)
Anion gap: 12 (ref 5–15)
BUN: 16 mg/dL (ref 6–20)
CO2: 28 mmol/L (ref 22–32)
Calcium: 8.7 mg/dL — ABNORMAL LOW (ref 8.9–10.3)
Chloride: 101 mmol/L (ref 98–111)
Creatinine, Ser: 0.73 mg/dL (ref 0.61–1.24)
GFR, Estimated: >60 mL/min (ref >60)
Glucose, Bld: 176 mg/dL — ABNORMAL HIGH (ref 70–99)
Potassium: 3.5 mmol/L (ref 3.5–5.1)
Sodium: 141 mmol/L (ref 135–145)
Total Bilirubin: 0.6 mg/dL (ref 0.3–1.2)
Total Protein: 6.8 g/dL (ref 6.5–8.1)

## 2020-04-18 LAB — CBC WITH DIFFERENTIAL/PLATELET
Abs Immature Granulocytes: 0.02 10*3/uL (ref 0.00–0.07)
Basophils Absolute: 0 10*3/uL (ref 0.0–0.1)
Basophils Relative: 0 %
Eosinophils Absolute: 0 10*3/uL (ref 0.0–0.5)
Eosinophils Relative: 0 %
HCT: 42.5 % (ref 39.0–52.0)
Hemoglobin: 14.2 g/dL (ref 13.0–17.0)
Immature Granulocytes: 1 %
Lymphocytes Relative: 11 %
Lymphs Abs: 0.4 10*3/uL — ABNORMAL LOW (ref 0.7–4.0)
MCH: 28.4 pg (ref 26.0–34.0)
MCHC: 33.4 g/dL (ref 30.0–36.0)
MCV: 85 fL (ref 80.0–100.0)
Monocytes Absolute: 0.1 10*3/uL (ref 0.1–1.0)
Monocytes Relative: 2 %
Neutro Abs: 3.2 10*3/uL (ref 1.7–7.7)
Neutrophils Relative %: 86 %
Platelets: 192 10*3/uL (ref 150–400)
RBC: 5 MIL/uL (ref 4.22–5.81)
RDW: 12.7 % (ref 11.5–15.5)
WBC: 3.7 10*3/uL — ABNORMAL LOW (ref 4.0–10.5)
nRBC: 0 % (ref 0.0–0.2)

## 2020-04-18 LAB — BLOOD GAS, ARTERIAL
Acid-Base Excess: 5 mmol/L — ABNORMAL HIGH (ref 0.0–2.0)
Bicarbonate: 28.7 mmol/L — ABNORMAL HIGH (ref 20.0–28.0)
Drawn by: 42624
FIO2: 68
O2 Saturation: 86.7 %
Patient temperature: 36.6
pCO2 arterial: 39.4 mmHg (ref 32.0–48.0)
pH, Arterial: 7.473 — ABNORMAL HIGH (ref 7.350–7.450)
pO2, Arterial: 49 mmHg — ABNORMAL LOW (ref 83.0–108.0)

## 2020-04-18 LAB — C-REACTIVE PROTEIN: CRP: 20.4 mg/dL — ABNORMAL HIGH (ref <1.0)

## 2020-04-18 LAB — BRAIN NATRIURETIC PEPTIDE: B Natriuretic Peptide: 141.6 pg/mL — ABNORMAL HIGH (ref 0.0–100.0)

## 2020-04-18 LAB — D-DIMER, QUANTITATIVE: D-Dimer, Quant: 0.94 ug/mL-FEU — ABNORMAL HIGH (ref 0.00–0.50)

## 2020-04-18 LAB — MAGNESIUM: Magnesium: 2.3 mg/dL (ref 1.7–2.4)

## 2020-04-18 MED ORDER — ENOXAPARIN SODIUM 60 MG/0.6ML ~~LOC~~ SOLN
50.0000 mg | Freq: Two times a day (BID) | SUBCUTANEOUS | Status: DC
  Administered 2020-04-18 – 2020-04-22 (×9): 50 mg via SUBCUTANEOUS
  Filled 2020-04-18 (×9): qty 0.6

## 2020-04-18 MED ORDER — SALINE SPRAY 0.65 % NA SOLN
1.0000 | NASAL | Status: DC | PRN
  Administered 2020-04-18: 1 via NASAL
  Filled 2020-04-18: qty 44

## 2020-04-18 MED ORDER — TOCILIZUMAB 400 MG/20ML IV SOLN
800.0000 mg | Freq: Once | INTRAVENOUS | Status: AC
  Administered 2020-04-18: 800 mg via INTRAVENOUS
  Filled 2020-04-18: qty 40

## 2020-04-18 MED ORDER — CARVEDILOL 3.125 MG PO TABS
3.1250 mg | ORAL_TABLET | Freq: Two times a day (BID) | ORAL | Status: DC
  Administered 2020-04-18 – 2020-04-19 (×3): 3.125 mg via ORAL
  Filled 2020-04-18 (×3): qty 1

## 2020-04-18 MED ORDER — POTASSIUM CHLORIDE CRYS ER 20 MEQ PO TBCR
40.0000 meq | EXTENDED_RELEASE_TABLET | Freq: Once | ORAL | Status: AC
  Administered 2020-04-18: 40 meq via ORAL
  Filled 2020-04-18: qty 2

## 2020-04-18 MED ORDER — ALBUTEROL SULFATE HFA 108 (90 BASE) MCG/ACT IN AERS
2.0000 | INHALATION_SPRAY | RESPIRATORY_TRACT | Status: DC | PRN
  Administered 2020-04-18: 2 via RESPIRATORY_TRACT

## 2020-04-18 NOTE — Progress Notes (Signed)
°   04/18/20 0055  Assess: MEWS Score  O2 Device HFNC  Patient Activity (if Appropriate) In bed  O2 Flow Rate (L/min) 12 L/min  Notify: Charge Nurse/RN  Name of Charge Nurse/RN Notified Chief Technology Officer  Date Charge Nurse/RN Notified 04/18/20  Time Charge Nurse/RN Notified 0100  Notify: Provider  Provider Name/Title [facility]  Date Provider Notified 04/18/20  Time Provider Notified 0101  Notification Type Page  Notification Reason Change in status (increased O2 demand)  Response See new orders  Date of Provider Response 04/18/20  Time of Provider Response 0104  Document  Patient Outcome Stabilized after interventions   Patient's O2 stat between 50-60%, patient O2 cannula partially dislodged out of nostril, and reports being short of breath. Vital signs assessed- green mews noted. No change in O2 saturation noted after O2 cannula was replaced, patient increased to 10L via HFNC, O2 improved to 70s range.  Patient increased to 12L HFNC, improved to 86-92%.  RT notified, en route to patient's bedside.  MD notified, see orders.  Charge RN notified of situation.

## 2020-04-18 NOTE — Progress Notes (Addendum)
PROGRESS NOTE                                                                                                                                                                                                             Patient Demographics:    Erik Guerra, is a 59 y.o. male, DOB - [birth year], DGL:[id number]  Outpatient Primary MD for the patient is Patient, No Pcp Per    LOS - 2  Admit date - 04/16/2020    Chief Complaint  Patient presents with  . Covid Positive       Brief Narrative (HPI from H&P)  - Erik Guerra is a 59 y.o. male with history of asthma presently not on any medication presents to the ER because of shortness of breath.  Patient states he started developing symptoms of fatigue weakness and fever and chills about 10 days ago when he attended a fair, he was diagnosed with acute hypoxic respiratory failure due to COVID-19 pneumonia and admitted to the hospital.  Was vaccinated with J&J vaccine.   Subjective:   Patient in bed, appears comfortable, denies any headache, no fever, no chest pain or pressure, +ve shortness of breath , no abdominal pain. No focal weakness.   Assessment  & Plan :    1. Acute Hypoxic Resp. Failure due to Acute Covid 19 Viral Pneumonitis during the ongoing 2020 Covid 19 Pandemic - he is unfortunately this is a true breakthrough in a fully vaccinated person with J&J vaccine in the past. He incurred severe parenchymal lung injury, has been placed on IV steroids and Remdesivir, received first dose of Actemra on 04/17/2020, still quite hypoxic with elevated CRP will need second dose Actemra on 04/18/2020.  Continue supplementation oxygen, monitor closely.  Overall quite tenuous.  Encouraged the patient to sit up in chair in the daytime use I-S and flutter valve for pulmonary toiletry and then prone in bed when at night.  Will advance activity and titrate down oxygen as possible.     SpO2: 91 % O2 Flow Rate (L/min): 12 L/min  Recent Labs  Lab 04/16/20 2052 04/16/20 2247 04/16/20 2248 04/17/20 0212 04/17/20 0303 04/17/20 0314 04/17/20 0922 04/18/20 0136  WBC 6.9  --   --  6.7  --   --   --  3.7*  HGB 12.8*  --   --  12.8*  --   --   --  14.2  HCT 39.3  --   --  38.7*  --   --   --  42.5  PLT 138*  --   --  147*  --   --   --  192  CRP  --   --  26.5*  --   --   --  29.9* 20.4*  BNP  --   --   --   --   --   --  62.6 141.6*  DDIMER  --   --  0.49  --   --   --  0.61* 0.94*  PROCALCITON  --   --  0.13  --   --   --   --   --   AST  --   --   --   --   --   --   --  25  ALT  --   --   --   --   --   --   --  19  ALKPHOS  --   --   --   --   --   --   --  44  BILITOT  --   --   --   --   --   --   --  0.6  ALBUMIN  --   --   --   --   --   --   --  2.8*  LATICACIDVEN  --  1.1  --   --   --  1.0  --   --   SARSCOV2NAA  --   --   --   --  POSITIVE*  --   --   --      2. Undiagnosed hypertension.  Been placed on Norvasc, will add Coreg for better control along with as needed hydralazine  3. Mild anemia and thrombocytopenia.  Could be due to viral infection.  PCP to monitor post discharge and do age-appropriate outpatient work-up if needed.     Condition - Extremely Guarded  Family Communication  :  Wife [name] [phone] on 04/17/20 @ 10/24 am - message left, updated 04/18/2020 at 9:18 AM.  Code Status : Full  Consults  : None  Procedures  :  None  PUD Prophylaxis : None  Disposition Plan  :    Status is: Inpatient  Remains inpatient appropriate because:IV treatments appropriate due to intensity of illness or inability to take PO   Dispo: The patient is from: Home              Anticipated d/c is to: Home              Anticipated d/c date is: > 3 days              Patient currently is not medically stable to d/c.   DVT Prophylaxis  :  Lovenox    Lab Results  Component Value Date   PLT 192 04/18/2020    Diet :  Diet Order             Diet regular Room service appropriate? Yes; Fluid consistency: Thin  Diet effective now                  Inpatient Medications  Scheduled Meds: . albuterol  2 puff Inhalation Q4H  . amLODipine  10 mg Oral Daily  . vitamin C  500 mg Oral Daily  .  enoxaparin (LOVENOX) injection  40 mg Subcutaneous Q12H  . methylPREDNISolone (SOLU-MEDROL) injection  60 mg Intravenous Q12H  . pneumococcal 23 valent vaccine  0.5 mL Intramuscular Tomorrow-1000  . potassium chloride  40 mEq Oral Once  . zinc sulfate  220 mg Oral Daily   Continuous Infusions: . remdesivir 100 mg in NS 100 mL 100 mg (04/18/20 0901)  . tocilizumab (ACTEMRA) - non-COVID treatment     PRN Meds:.acetaminophen **OR** [DISCONTINUED] acetaminophen, albuterol, chlorpheniramine-HYDROcodone, guaiFENesin-dextromethorphan, hydrALAZINE, melatonin  Antibiotics  :    Anti-infectives (From admission, onward)   Start     Dose/Rate Route Frequency Ordered Stop   04/18/20 1000  remdesivir 100 mg in sodium chloride 0.9 % 100 mL IVPB       "Followed by" Linked Group Details   100 mg 200 mL/hr over 30 Minutes Intravenous Daily 04/17/20 0705 04/22/20 0959   04/18/20 0000  cefTRIAXone (ROCEPHIN) 2 g in sodium chloride 0.9 % 100 mL IVPB  Status:  Discontinued        2 g 200 mL/hr over 30 Minutes Intravenous Every 24 hours 04/17/20 0214 04/17/20 1026   04/17/20 0800  remdesivir 200 mg in sodium chloride 0.9% 250 mL IVPB       "Followed by" Linked Group Details   200 mg 580 mL/hr over 30 Minutes Intravenous Once 04/17/20 0705 04/17/20 0932   04/17/20 0300  azithromycin (ZITHROMAX) 500 mg in sodium chloride 0.9 % 250 mL IVPB  Status:  Discontinued        500 mg 250 mL/hr over 60 Minutes Intravenous Every 24 hours 04/17/20 0214 04/17/20 1026   04/17/20 0230  cefTRIAXone (ROCEPHIN) 2 g in sodium chloride 0.9 % 100 mL IVPB        2 g 200 mL/hr over 30 Minutes Intravenous  Once 04/17/20 0219 04/17/20 0332       Time Spent in minutes   30   Susa RaringPrashant Annie Roseboom M.D on 04/18/2020 at 9:16 AM  To page go to [email or web address] - password [email or web address]  [facility] -  Office  [phone]   See all Orders from today for further details    Objective:   Vitals:   04/18/20 0055 04/18/20 0100 04/18/20 0424 04/18/20 0901  BP:   (!) 159/77 (!) 160/84  Pulse: 80 81 74   Resp: 14 13 20    Temp:   98 F (36.7 C)   TempSrc:   Oral   SpO2: (!) 87% (!) 88% 91%   Weight:      Height:        Wt Readings from Last 3 Encounters:  04/17/20 107 kg     Intake/Output Summary (Last 24 hours) at 04/18/2020 0916 Last data filed at 04/18/2020 0854 Gross per 24 hour  Intake 210 ml  Output 1500 ml  Net -1290 ml     Physical Exam  Awake Alert, No new F.N deficits, Normal affect [place].AT,PERRAL Supple Neck,No JVD, No cervical lymphadenopathy appriciated.  Symmetrical Chest wall movement, Good air movement bilaterally, CTAB RRR,No Gallops, Rubs or new Murmurs, No Parasternal Heave +ve B.Sounds, Abd Soft, No tenderness, No organomegaly appriciated, No rebound - guarding or rigidity. No Cyanosis, Clubbing or edema, No new Rash or bruise    Data Review:    CBC Recent Labs  Lab 04/16/20 2052 04/17/20 0212 04/18/20 0136  WBC 6.9 6.7 3.7*  HGB 12.8* 12.8* 14.2  HCT 39.3 38.7* 42.5  PLT 138* 147* 192  MCV 86.8 87.2 85.0  MCH 28.3 28.8 28.4  MCHC 32.6 33.1 33.4  RDW 12.9 13.0 12.7  LYMPHSABS  --   --  0.4*  MONOABS  --   --  0.1  EOSABS  --   --  0.0  BASOSABS  --   --  0.0    Recent Labs  Lab 04/16/20 2052 04/16/20 2247 04/16/20 2248 04/17/20 0212 04/17/20 0314 04/17/20 0922 04/18/20 0136  NA 139  --   --   --   --  140 141  K 3.1*  --   --   --   --  3.7 3.5  CL 98  --   --   --   --  98 101  CO2 29  --   --   --   --  28 28  GLUCOSE 111*  --   --   --   --  146* 176*  BUN 16  --   --   --   --  12 16  CREATININE 0.94  --   --  0.78  --  0.75 0.73  CALCIUM 8.6*  --   --   --   --  8.5* 8.7*  AST  --   --   --    --   --   --  25  ALT  --   --   --   --   --   --  19  ALKPHOS  --   --   --   --   --   --  44  BILITOT  --   --   --   --   --   --  0.6  ALBUMIN  --   --   --   --   --   --  2.8*  MG  --   --   --   --   --  2.3 2.3  CRP  --   --  26.5*  --   --  29.9* 20.4*  DDIMER  --   --  0.49  --   --  0.61* 0.94*  PROCALCITON  --   --  0.13  --   --   --   --   LATICACIDVEN  --  1.1  --   --  1.0  --   --   BNP  --   --   --   --   --  62.6 141.6*    ------------------------------------------------------------------------------------------------------------------ Recent Labs    04/16/20 2248  TRIG 81    No results found for: HGBA1C ------------------------------------------------------------------------------------------------------------------ No results for input(s): TSH, T4TOTAL, T3FREE, THYROIDAB in the last 72 hours.  Invalid input(s): FREET3  Cardiac Enzymes No results for input(s): CKMB, TROPONINI, MYOGLOBIN in the last 168 hours.  Invalid input(s): CK ------------------------------------------------------------------------------------------------------------------    Component Value Date/Time   BNP 141.6 (H) 04/18/2020 0136    Micro Results Recent Results (from the past 240 hour(s))  Blood Culture (routine x 2)     Status: None (Preliminary result)   Collection Time: 04/16/20  9:41 PM   Specimen: BLOOD  Result Value Ref Range Status   Specimen Description BLOOD SITE NOT SPECIFIED  Final   Special Requests   Final    BOTTLES DRAWN AEROBIC AND ANAEROBIC Blood Culture results may not be optimal due to an excessive volume of blood received in culture bottles   Culture   Final    NO GROWTH < 12 HOURS Performed at [facility], [address], [place], [place] [address]    Report Status PENDING  Incomplete  Respiratory Panel by RT PCR (Flu A&B, Covid) - Nasopharyngeal Swab     Status: Abnormal   Collection Time: 04/17/20  3:03 AM   Specimen: Nasopharyngeal Swab   Result Value Ref Range Status   SARS Coronavirus 2 by RT PCR POSITIVE (A) NEGATIVE Final    Comment: RESULT CALLED TO, READ BACK BY AND VERIFIED WITH: Conception Chancy RN 04/17/20 0538 JDW (NOTE) SARS-CoV-2 target nucleic acids are DETECTED.  SARS-CoV-2 RNA is generally detectable in upper respiratory specimens  during the acute phase of infection. Positive results are indicative of the presence of the identified virus, but do not rule out bacterial infection or co-infection with other pathogens not detected by the test. Clinical correlation with patient history and other diagnostic information is necessary to determine patient infection status. The expected result is Negative.  Fact Sheet for Patients:  https://www.moore.com/  Fact Sheet for Healthcare Providers: https://www.young.biz/  This test is not yet approved or cleared by the [place] FDA and  has been authorized for detection and/or diagnosis of SARS-CoV-2 by FDA under an Emergency Use Authorization (EUA).  This EUA will remain in effect (meaning this test can be [place] ed) for the duration of  the COVID-19 declaration under Section 564(b)(1) of the Act, 21 U.S.C. section 360bbb-3(b)(1), unless the authorization is terminated or revoked sooner.      Influenza A by PCR NEGATIVE NEGATIVE Final   Influenza B by PCR NEGATIVE NEGATIVE Final    Comment: (NOTE) The Xpert Xpress SARS-CoV-2/FLU/RSV assay is intended as an aid in  the diagnosis of influenza from Nasopharyngeal swab specimens and  should not be used as a sole basis for treatment. Nasal washings and  aspirates are unacceptable for Xpert Xpress SARS-CoV-2/FLU/RSV  testing.  Fact Sheet for Patients: https://www.moore.com/  Fact Sheet for Healthcare Providers: https://www.young.biz/  This test is not yet approved or cleared by the [place] FDA and  has been authorized for  detection and/or diagnosis of SARS-CoV-2 by  FDA under an Emergency Use Authorization (EUA). This EUA will remain  in effect (meaning this test can be used) for the duration of the  Covid-19 declaration under Section 564(b)(1) of the Act, 21  U.S.C. section 360bbb-3(b)(1), unless the authorization is  terminated or revoked. Performed at [facility], [address] [place], [place] [address]     Radiology Reports DG Chest Portable 1 View  Result Date: 04/16/2020 CLINICAL DATA:  Shortness of breath COVID positive EXAM: PORTABLE CHEST 1 VIEW COMPARISON:  None. FINDINGS: The heart size and mediastinal contours mildly enlarged. There is diffusely increased interstitial markings seen throughout both lungs. There is patchy airspace opacity seen at the left lung base and periphery of the right lung base. No pleural effusion. IMPRESSION: Diffusely increased interstitial and patchy airspace opacities, likely consistent with multifocal pneumonia. Electronically Signed   By: Jonna Clark M.D.   On: 04/16/2020 21:36

## 2020-04-18 NOTE — Progress Notes (Signed)
°   04/18/20 1200  Vitals  Pulse Rate 79  ECG Heart Rate 79  Resp (!) 21  MEWS COLOR  MEWS Score Color Green  Oxygen Therapy  SpO2 93 %  O2 Device HFNC  Heater temperature 90.7 F (32.6 C)  O2 Flow Rate (L/min) 30 L/min  MEWS Score  MEWS Temp 0  MEWS Systolic 0  MEWS Pulse 0  MEWS RR 1  MEWS LOC 0  MEWS Score 1   Increased oxygen demands to 15L HFNC and intermittent NRB. Pt looks more tired and moaning with each breath but RR remain < 20. MD notified, RT called to initiate HHFL. Pt now on HHFL 30 L 100% with oxygen sats in the low 90s.

## 2020-04-18 NOTE — Progress Notes (Signed)
Physical Therapy Treatment Patient Details Name: Erik Guerra MRN: [id number] DOB: 06-21-1961 Today's Date: 04/18/2020    History of Present Illness 59yo male found to be covid positive after attending the fair. PMH asthma    PT Comments    Patient received up in chair on [place] at 30LPM; immediately desatted down to 80% and had HR elevation just speaking to me in the chair. RN entered room and assisted with session and line management- needed addition of NRB mask to maintain sats at 85% and up during transfer. Nursing staff assisted with line management during transfer, patient able to transfer and perform bed mobility at a S-min guard level. Unable to progress gait due to still being on HHFNC and NRB today. Left sidelying in bed with all needs met and RN present and attending.   Follow Up Recommendations  Home health PT     Equipment Recommendations  None recommended by PT    Recommendations for Other Services       Precautions / Restrictions Precautions Precautions: Fall;Other (comment) Precaution Comments: covid +, very anxious Restrictions Weight Bearing Restrictions: No    Mobility  Bed Mobility Overal bed mobility: Needs Assistance Bed Mobility: Sit to Supine       Sit to supine: Supervision   General bed mobility comments: S for safety, line management  Transfers Overall transfer level: Needs assistance Equipment used: None Transfers: Sit to/from Stand;Stand Pivot Transfers Sit to Stand: Min guard Stand pivot transfers: Min guard       General transfer comment: min guard for safety, assist for line management  Ambulation/Gait         Gait velocity: decrease   General Gait Details: deferred- on HHFNC and NRB   Stairs             Wheelchair Mobility    Modified Rankin (Stroke Patients Only)       Balance Overall balance assessment: Mild deficits observed, not formally tested                                           Cognition Arousal/Alertness: Awake/alert Behavior During Therapy: WFL for tasks assessed/performed;Anxious Overall Cognitive Status: Within Functional Limits for tasks assessed                                 General Comments: still generally anxious      Exercises      General Comments General comments (skin integrity, edema, etc.): desat down to 80% just on HHFNC talking to me in chair; needed addition of NRB for transfer with stats 85-86% with HHFNC/NRB      Pertinent Vitals/Pain Pain Assessment: Faces Faces Pain Scale: Hurts a little bit Pain Location: generalized discomfort Pain Intervention(s): Limited activity within patient's tolerance;Monitored during session;Repositioned    Home Living                      Prior Function            PT Goals (current goals can now be found in the care plan section) Acute Rehab PT Goals Patient Stated Goal: get well and off of this unit PT Goal Formulation: With patient Time For Goal Achievement: 05/01/20 Potential to Achieve Goals: Fair Progress towards PT goals: Progressing toward goals (slowly)    Frequency  Min 3X/week      PT Plan Current plan remains appropriate    Co-evaluation              AM-PAC PT "6 Clicks" Mobility   Outcome Measure  Help needed turning from your back to your side while in a flat bed without using bedrails?: None Help needed moving from lying on your back to sitting on the side of a flat bed without using bedrails?: None Help needed moving to and from a bed to a chair (including a wheelchair)?: A Little Help needed standing up from a chair using your arms (e.g., wheelchair or bedside chair)?: A Little Help needed to walk in hospital room?: A Little Help needed climbing 3-5 steps with a railing? : A Little 6 Click Score: 20    End of Session Equipment Utilized During Treatment: Oxygen Activity Tolerance: Patient tolerated treatment well Patient left:  in bed;with call bell/phone within reach;with nursing/sitter in room Nurse Communication: Mobility status PT Visit Diagnosis: Unsteadiness on feet (R26.81);Difficulty in walking, not elsewhere classified (R26.2)     Time: 8101-7510 PT Time Calculation (min) (ACUTE ONLY): 12 min  Charges:  $Therapeutic Activity: 8-22 mins                     Windell Norfolk, DPT, PN1   Supplemental Physical Therapist [facility]    Pager [phone] Acute Rehab Office [phone]

## 2020-04-19 ENCOUNTER — Inpatient Hospital Stay (HOSPITAL_COMMUNITY): Payer: HRSA Program

## 2020-04-19 LAB — CBC WITH DIFFERENTIAL/PLATELET
Abs Immature Granulocytes: 0.09 10*3/uL — ABNORMAL HIGH (ref 0.00–0.07)
Basophils Absolute: 0 10*3/uL (ref 0.0–0.1)
Basophils Relative: 0 %
Eosinophils Absolute: 0 10*3/uL (ref 0.0–0.5)
Eosinophils Relative: 0 %
HCT: 43.2 % (ref 39.0–52.0)
Hemoglobin: 14.6 g/dL (ref 13.0–17.0)
Immature Granulocytes: 1 %
Lymphocytes Relative: 9 %
Lymphs Abs: 0.8 10*3/uL (ref 0.7–4.0)
MCH: 28.5 pg (ref 26.0–34.0)
MCHC: 33.8 g/dL (ref 30.0–36.0)
MCV: 84.2 fL (ref 80.0–100.0)
Monocytes Absolute: 0.4 10*3/uL (ref 0.1–1.0)
Monocytes Relative: 4 %
Neutro Abs: 7.5 10*3/uL (ref 1.7–7.7)
Neutrophils Relative %: 86 %
Platelets: 262 10*3/uL (ref 150–400)
RBC: 5.13 MIL/uL (ref 4.22–5.81)
RDW: 12.5 % (ref 11.5–15.5)
WBC: 8.8 10*3/uL (ref 4.0–10.5)
nRBC: 0 % (ref 0.0–0.2)

## 2020-04-19 LAB — COMPREHENSIVE METABOLIC PANEL
ALT: 19 U/L (ref 0–44)
AST: 22 U/L (ref 15–41)
Albumin: 3 g/dL — ABNORMAL LOW (ref 3.5–5.0)
Alkaline Phosphatase: 51 U/L (ref 38–126)
Anion gap: 14 (ref 5–15)
BUN: 23 mg/dL — ABNORMAL HIGH (ref 6–20)
CO2: 25 mmol/L (ref 22–32)
Calcium: 9 mg/dL (ref 8.9–10.3)
Chloride: 100 mmol/L (ref 98–111)
Creatinine, Ser: 0.67 mg/dL (ref 0.61–1.24)
GFR, Estimated: >60 mL/min (ref >60)
Glucose, Bld: 158 mg/dL — ABNORMAL HIGH (ref 70–99)
Potassium: 3.4 mmol/L — ABNORMAL LOW (ref 3.5–5.1)
Sodium: 139 mmol/L (ref 135–145)
Total Bilirubin: 0.7 mg/dL (ref 0.3–1.2)
Total Protein: 6.8 g/dL (ref 6.5–8.1)

## 2020-04-19 LAB — MAGNESIUM: Magnesium: 2.2 mg/dL (ref 1.7–2.4)

## 2020-04-19 LAB — C-REACTIVE PROTEIN: CRP: 9.7 mg/dL — ABNORMAL HIGH (ref <1.0)

## 2020-04-19 LAB — BRAIN NATRIURETIC PEPTIDE: B Natriuretic Peptide: 144.5 pg/mL — ABNORMAL HIGH (ref 0.0–100.0)

## 2020-04-19 LAB — D-DIMER, QUANTITATIVE: D-Dimer, Quant: 0.78 ug/mL-FEU — ABNORMAL HIGH (ref 0.00–0.50)

## 2020-04-19 LAB — LEGIONELLA PNEUMOPHILA SEROGP 1 UR AG: L. pneumophila Serogp 1 Ur Ag: NEGATIVE

## 2020-04-19 MED ORDER — LORAZEPAM 2 MG/ML IJ SOLN
1.0000 mg | Freq: Once | INTRAMUSCULAR | Status: AC
  Administered 2020-04-19: 1 mg via INTRAVENOUS
  Filled 2020-04-19: qty 1

## 2020-04-19 MED ORDER — POTASSIUM CHLORIDE CRYS ER 20 MEQ PO TBCR
40.0000 meq | EXTENDED_RELEASE_TABLET | Freq: Once | ORAL | Status: AC
  Administered 2020-04-19: 40 meq via ORAL
  Filled 2020-04-19: qty 2

## 2020-04-19 MED ORDER — CARVEDILOL 6.25 MG PO TABS
6.2500 mg | ORAL_TABLET | Freq: Two times a day (BID) | ORAL | Status: DC
  Administered 2020-04-19 – 2020-04-22 (×6): 6.25 mg via ORAL
  Filled 2020-04-19 (×6): qty 1

## 2020-04-19 NOTE — Progress Notes (Signed)
Pt is declining to sit up in chair - pt was helped to the chair with MD in room - he is back in bed stating he "needs to lay down".  Education provided on the importance of sitting up in chair, pt states "I know".

## 2020-04-19 NOTE — Progress Notes (Signed)
Pt switched from [facility] to regular HFNC (salter) 15L. Pt tolerating well

## 2020-04-19 NOTE — Progress Notes (Addendum)
Patient has had a restless night. Initially patient was disrupted by condensation in his tubing of the heated high flow oxygen system.  This happened several times until 0000. Respiratory was paged and changed the humidifier and adjusted the temperature. Patient continues to report HA from noise of the high flow oxygen and given tylenol twice during shift.   Patient was anxious and took off his oxygen after using the urinal; dropping to the mid 70s; patient educated on the importance of keeping the oxygen system on and placed it back on.  He has stated several times that he wants to take the entire system off and go home.  He continues to report frustration with his R nostril being stopped up; has used saline spray but no improvement reported.  He was given ear plugs but states this does not help.  Due to his SBP I've given him hydralazine PRN twice.   MD paged; patient given one time dose of IV Ativan for anxiety at 0450.
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
## 2020-04-20 ENCOUNTER — Inpatient Hospital Stay (HOSPITAL_COMMUNITY): Payer: HRSA Program

## 2020-04-20 LAB — COMPREHENSIVE METABOLIC PANEL
ALT: 26 U/L (ref 0–44)
AST: 23 U/L (ref 15–41)
Albumin: 3 g/dL — ABNORMAL LOW (ref 3.5–5.0)
Alkaline Phosphatase: 44 U/L (ref 38–126)
Anion gap: 10 (ref 5–15)
BUN: 30 mg/dL — ABNORMAL HIGH (ref 6–20)
CO2: 26 mmol/L (ref 22–32)
Calcium: 9.2 mg/dL (ref 8.9–10.3)
Chloride: 104 mmol/L (ref 98–111)
Creatinine, Ser: 0.75 mg/dL (ref 0.61–1.24)
GFR, Estimated: >60 mL/min (ref >60)
Glucose, Bld: 139 mg/dL — ABNORMAL HIGH (ref 70–99)
Potassium: 4.3 mmol/L (ref 3.5–5.1)
Sodium: 140 mmol/L (ref 135–145)
Total Bilirubin: 0.8 mg/dL (ref 0.3–1.2)
Total Protein: 6.6 g/dL (ref 6.5–8.1)

## 2020-04-20 LAB — CBC WITH DIFFERENTIAL/PLATELET
Abs Immature Granulocytes: 0.19 10*3/uL — ABNORMAL HIGH (ref 0.00–0.07)
Basophils Absolute: 0 10*3/uL (ref 0.0–0.1)
Basophils Relative: 0 %
Eosinophils Absolute: 0 10*3/uL (ref 0.0–0.5)
Eosinophils Relative: 0 %
HCT: 43.3 % (ref 39.0–52.0)
Hemoglobin: 15 g/dL (ref 13.0–17.0)
Immature Granulocytes: 2 %
Lymphocytes Relative: 14 %
Lymphs Abs: 1.2 10*3/uL (ref 0.7–4.0)
MCH: 29.4 pg (ref 26.0–34.0)
MCHC: 34.6 g/dL (ref 30.0–36.0)
MCV: 84.7 fL (ref 80.0–100.0)
Monocytes Absolute: 0.7 10*3/uL (ref 0.1–1.0)
Monocytes Relative: 8 %
Neutro Abs: 7 10*3/uL (ref 1.7–7.7)
Neutrophils Relative %: 76 %
Platelets: 297 10*3/uL (ref 150–400)
RBC: 5.11 MIL/uL (ref 4.22–5.81)
RDW: 12.8 % (ref 11.5–15.5)
WBC: 9.1 10*3/uL (ref 4.0–10.5)
nRBC: 0 % (ref 0.0–0.2)

## 2020-04-20 LAB — MAGNESIUM: Magnesium: 2.3 mg/dL (ref 1.7–2.4)

## 2020-04-20 LAB — BRAIN NATRIURETIC PEPTIDE: B Natriuretic Peptide: 61.9 pg/mL (ref 0.0–100.0)

## 2020-04-20 LAB — PROCALCITONIN: Procalcitonin: <0.1 ng/mL

## 2020-04-20 LAB — C-REACTIVE PROTEIN: CRP: 3.9 mg/dL — ABNORMAL HIGH (ref <1.0)

## 2020-04-20 LAB — D-DIMER, QUANTITATIVE: D-Dimer, Quant: 0.68 ug/mL-FEU — ABNORMAL HIGH (ref 0.00–0.50)

## 2020-04-20 MED ORDER — HYDRALAZINE HCL 50 MG PO TABS
50.0000 mg | ORAL_TABLET | Freq: Three times a day (TID) | ORAL | Status: DC
  Administered 2020-04-20 – 2020-04-22 (×7): 50 mg via ORAL
  Filled 2020-04-20 (×8): qty 1

## 2020-04-20 MED ORDER — BUPRENORPHINE HCL-NALOXONE HCL 8-2 MG SL SUBL
SUBLINGUAL_TABLET | Freq: Every day | SUBLINGUAL | Status: DC
  Administered 2020-04-20 – 2020-04-22 (×3): 1 via SUBLINGUAL
  Filled 2020-04-20 (×2): qty 1
  Filled 2020-04-20: qty 8

## 2020-04-20 MED ORDER — ALBUTEROL SULFATE HFA 108 (90 BASE) MCG/ACT IN AERS
2.0000 | INHALATION_SPRAY | Freq: Three times a day (TID) | RESPIRATORY_TRACT | Status: DC
  Administered 2020-04-20 – 2020-04-21 (×5): 2 via RESPIRATORY_TRACT
  Filled 2020-04-20: qty 6.7
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
## 2020-04-21 LAB — CBC WITH DIFFERENTIAL/PLATELET
Abs Immature Granulocytes: 0.19 10*3/uL — ABNORMAL HIGH (ref 0.00–0.07)
Basophils Absolute: 0 10*3/uL (ref 0.0–0.1)
Basophils Relative: 0 %
Eosinophils Absolute: 0 10*3/uL (ref 0.0–0.5)
Eosinophils Relative: 0 %
HCT: 41.9 % (ref 39.0–52.0)
Hemoglobin: 14.1 g/dL (ref 13.0–17.0)
Immature Granulocytes: 2 %
Lymphocytes Relative: 19 %
Lymphs Abs: 2.2 10*3/uL (ref 0.7–4.0)
MCH: 28.4 pg (ref 26.0–34.0)
MCHC: 33.7 g/dL (ref 30.0–36.0)
MCV: 84.3 fL (ref 80.0–100.0)
Monocytes Absolute: 1 10*3/uL (ref 0.1–1.0)
Monocytes Relative: 9 %
Neutro Abs: 7.9 10*3/uL — ABNORMAL HIGH (ref 1.7–7.7)
Neutrophils Relative %: 70 %
Platelets: 321 10*3/uL (ref 150–400)
RBC: 4.97 MIL/uL (ref 4.22–5.81)
RDW: 12.8 % (ref 11.5–15.5)
WBC: 11.3 10*3/uL — ABNORMAL HIGH (ref 4.0–10.5)
nRBC: 0 % (ref 0.0–0.2)

## 2020-04-21 LAB — CULTURE, BLOOD (ROUTINE X 2): Culture: NO GROWTH

## 2020-04-21 LAB — COMPREHENSIVE METABOLIC PANEL
ALT: 67 U/L — ABNORMAL HIGH (ref 0–44)
AST: 41 U/L (ref 15–41)
Albumin: 2.8 g/dL — ABNORMAL LOW (ref 3.5–5.0)
Alkaline Phosphatase: 37 U/L — ABNORMAL LOW (ref 38–126)
Anion gap: 10 (ref 5–15)
BUN: 29 mg/dL — ABNORMAL HIGH (ref 6–20)
CO2: 25 mmol/L (ref 22–32)
Calcium: 8.9 mg/dL (ref 8.9–10.3)
Chloride: 101 mmol/L (ref 98–111)
Creatinine, Ser: 0.75 mg/dL (ref 0.61–1.24)
GFR, Estimated: >60 mL/min (ref >60)
Glucose, Bld: 129 mg/dL — ABNORMAL HIGH (ref 70–99)
Potassium: 4.5 mmol/L (ref 3.5–5.1)
Sodium: 136 mmol/L (ref 135–145)
Total Bilirubin: 0.8 mg/dL (ref 0.3–1.2)
Total Protein: 6.1 g/dL — ABNORMAL LOW (ref 6.5–8.1)

## 2020-04-21 LAB — PROCALCITONIN: Procalcitonin: <0.1 ng/mL

## 2020-04-21 LAB — C-REACTIVE PROTEIN: CRP: 1.7 mg/dL — ABNORMAL HIGH (ref <1.0)

## 2020-04-21 LAB — BRAIN NATRIURETIC PEPTIDE: B Natriuretic Peptide: 50.1 pg/mL (ref 0.0–100.0)

## 2020-04-21 LAB — D-DIMER, QUANTITATIVE: D-Dimer, Quant: 0.66 ug/mL-FEU — ABNORMAL HIGH (ref 0.00–0.50)

## 2020-04-21 LAB — MAGNESIUM: Magnesium: 2.1 mg/dL (ref 1.7–2.4)

## 2020-04-21 MED ORDER — METHYLPREDNISOLONE SODIUM SUCC 125 MG IJ SOLR
60.0000 mg | Freq: Every day | INTRAMUSCULAR | Status: DC
  Administered 2020-04-22: 60 mg via INTRAVENOUS
  Filled 2020-04-21: qty 2

## 2020-04-21 NOTE — Plan of Care (Signed)
  Problem: Education: Goal: Knowledge of General Education information will improve Description: Including pain rating scale, medication(s)/side effects and non-pharmacologic comfort measures Outcome: Progressing   Problem: Clinical Measurements: Goal: Will remain free from infection Outcome: Progressing   Problem: Clinical Measurements: Goal: Respiratory complications will improve Outcome: Progressing   Problem: Clinical Measurements: Goal: Cardiovascular complication will be avoided Outcome: Progressing   Problem: Coping: Goal: Level of anxiety will decrease Outcome: Progressing   Problem: Safety: Goal: Ability to remain free from injury will improve Outcome: Progressing   Problem: Pain Managment: Goal: General experience of comfort will improve Outcome: Progressing   Problem: Skin Integrity: Goal: Risk for impaired skin integrity will decrease Outcome: Progressing
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
## 2020-04-21 NOTE — Evaluation (Signed)
Occupational Therapy Evaluation Patient Details Name: Erik Guerra MRN: [id number] DOB: Aug 24, 1960 Today's Date: 04/21/2020    History of Present Illness 59yo male found to be covid positive after attending the fair. PMH asthma   Clinical Impression   PTA, pt was living with his wife and was independent. Pt currently performing ADLs and functional mobility at  Supervision level. Pt performing oral care and shaving at sink with Supervision; SpO2 maintained in 90s on 3L. Provided education on [facility] for ADLs and IADLs and reviewed handout in full. Pt would benefit from further acute OT to facilitate safe dc. Recommend dc to home once medically stable per physician.    SpO2 90s on 3L O2, RR 17, and HR in 80s.      Follow Up Recommendations  No OT follow up    Equipment Recommendations  3 in 1 bedside commode (As shower seat)    Recommendations for Other Services PT consult     Precautions / Restrictions Precautions Precautions: Fall;Other (comment) Precaution Comments: covid +, very anxious      Mobility Bed Mobility               General bed mobility comments: In recliner upon arrival    Transfers Overall transfer level: Needs assistance Equipment used: None Transfers: Sit to/from [employer] Sit to Stand: Supervision         General transfer comment: Supervision for safety    Balance Overall balance assessment: Mild deficits observed, not formally tested                                         ADL either performed or assessed with clinical judgement   ADL Overall ADL's : Needs assistance/impaired                                       General ADL Comments: Pt performing ADLs and fucntional mobility at Supervision level for safety. Cues thorughout for purse lip breathing and taking breaks. Pt performing oral care and shaving at sink with supervision. Provided EC handout for ADLs and IADLs and reviewedi n  full     Vision Baseline Vision/History: Wears glasses Wears Glasses: Reading only Patient Visual Report: No change from baseline       Perception     Praxis      Pertinent Vitals/Pain Pain Assessment: No/denies pain     Hand Dominance Right   Extremity/Trunk Assessment Upper Extremity Assessment Upper Extremity Assessment: Overall WFL for tasks assessed   Lower Extremity Assessment Lower Extremity Assessment: Defer to PT evaluation   Cervical / Trunk Assessment Cervical / Trunk Assessment: Normal   Communication Communication Communication: No difficulties   Cognition Arousal/Alertness: Awake/alert Behavior During Therapy: WFL for tasks assessed/performed;Anxious Overall Cognitive Status: Within Functional Limits for tasks assessed                                     General Comments  Spo2 90s on 3L O2, RR 17, and HR in 80s.     Exercises Exercises: Other exercises Other Exercises Other Exercises: IS x 2 reps x 3 sets; pt max 1500 ml   Shoulder Instructions      Home Living Family/patient expects to be  discharged to:: Private residence Living Arrangements: Spouse/significant other Available Help at Discharge: Family;Available PRN/intermittently Type of Home: House Home Access: Stairs to enter [employer] of Steps: 2 Entrance Stairs-Rails: None Home Layout: One level     Bathroom Shower/Tub: Chief Strategy Officer: Standard     Home Equipment: None          Prior Functioning/Environment Level of Independence: Independent        Comments: works at [employer] with his wife- "lots of work", 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        OT Problem List: Decreased strength;Decreased range of motion;Decreased activity tolerance;Impaired balance (sitting and/or standing);Decreased knowledge of use of DME or AE;Decreased knowledge of precautions;Cardiopulmonary status  limiting activity      OT Treatment/Interventions: Self-care/ADL training;Therapeutic exercise;Energy conservation;DME and/or AE instruction;Therapeutic activities;Patient/family education    OT Goals(Current goals can be found in the care plan section) Acute Rehab OT Goals Patient Stated Goal: Get home OT Goal Formulation: With patient Time For Goal Achievement: 05/05/20 Potential to Achieve Goals: Good  OT Frequency: Min 2X/week   Barriers to D/C:            Co-evaluation              AM-PAC OT "6 Clicks" Daily Activity     Outcome Measure Help from another person eating meals?: None Help from another person taking care of personal grooming?: None Help from another person toileting, which includes using toliet, bedpan, or urinal?: None Help from another person bathing (including washing, rinsing, drying)?: None Help from another person to put on and taking off regular upper body clothing?: None Help from another person to put on and taking off regular lower body clothing?: None 6 Click Score: 24   End of Session Equipment Utilized During Treatment: Oxygen Nurse Communication: Mobility status  Activity Tolerance: Patient tolerated treatment well Patient left: in chair;with call bell/phone within reach  OT Visit Diagnosis: Unsteadiness on feet (R26.81);Other abnormalities of gait and mobility (R26.89);Muscle weakness (generalized) (M62.81)                Time: 3235-5732 OT Time Calculation (min): 33 min Charges:  OT General Charges $OT Visit: 1 Visit OT Evaluation $OT Eval Low Complexity: 1 Low  Gilberto Streck MSOT, OTR/L Acute Rehab Pager: [phone] Office: [phone]  Theodoro Grist Taylor Spilde 04/21/2020, 6:16 PM

## 2020-04-21 NOTE — Progress Notes (Signed)
Physical Therapy Treatment Patient Details Name: Erik Guerra MRN: [id number] DOB: 05-18-1961 Today's Date: 04/21/2020    History of Present Illness 58yo male found to be covid positive after attending the fair. PMH asthma    PT Comments    On arrival on 2L with sats 95%. With talking sats decr to 92% and initial standing down to 90%. Incr to 3L for ambulation with patient maintaining sats >90% x 80 ft. Once seated for rest, sats returned to 95% within 60 seconds. Patient reports he may go home tomorrow.     Follow Up Recommendations  Home health PT     Equipment Recommendations  None recommended by PT    Recommendations for Other Services       Precautions / Restrictions Precautions Precautions: Fall;Other (comment) Precaution Comments: covid +, very anxious    Mobility  Bed Mobility                  Transfers Overall transfer level: Needs assistance Equipment used: None Transfers: Sit to/from Stand;Stand Pivot Transfers Sit to Stand: Supervision            Ambulation/Gait Ambulation/Gait assistance: Min guard Gait Distance (Feet): 80 Feet (seated rest, 40) Assistive device: None Gait Pattern/deviations: Step-through pattern;Decreased stride length;Wide base of support Gait velocity: appropriately slow for his lungs   General Gait Details: 3L with sats >90%   Stairs             Wheelchair Mobility    Modified Rankin (Stroke Patients Only)       Balance Overall balance assessment: Mild deficits observed, not formally tested                                          Cognition Arousal/Alertness: Awake/alert Behavior During Therapy: WFL for tasks assessed/performed;Anxious Overall Cognitive Status: Within Functional Limits for tasks assessed                                        Exercises Other Exercises Other Exercises: IS x 2 reps x 3 sets; pt max 1500 ml    General Comments         Pertinent Vitals/Pain Pain Assessment: No/denies pain    Home Living                      Prior Function            PT Goals (current goals can now be found in the care plan section) Acute Rehab PT Goals Patient Stated Goal: get well and off of this unit Time For Goal Achievement: 05/01/20 Potential to Achieve Goals: Fair Progress towards PT goals: Progressing toward goals    Frequency    Min 3X/week      PT Plan Current plan remains appropriate    Co-evaluation              AM-PAC PT "6 Clicks" Mobility   Outcome Measure  Help needed turning from your back to your side while in a flat bed without using bedrails?: None Help needed moving from lying on your back to sitting on the side of a flat bed without using bedrails?: None Help needed moving to and from a bed to a chair (including a wheelchair)?: A Little Help needed standing up  from a chair using your arms (e.g., wheelchair or bedside chair)?: None Help needed to walk in hospital room?: A Little Help needed climbing 3-5 steps with a railing? : A Little 6 Click Score: 21    End of Session Equipment Utilized During Treatment: Oxygen Activity Tolerance: Patient tolerated treatment well Patient left: with call bell/phone within reach;in chair Nurse Communication: Mobility status PT Visit Diagnosis: Unsteadiness on feet (R26.81);Difficulty in walking, not elsewhere classified (R26.2)     Time: 6378-5885 PT Time Calculation (min) (ACUTE ONLY): 22 min  Charges:  $Gait Training: 8-22 mins                      Jerolyn Center, PT Pager [phone]    [name] 04/21/2020, 5:06 PM

## 2020-04-22 LAB — CBC WITH DIFFERENTIAL/PLATELET
Abs Immature Granulocytes: 0.23 10*3/uL — ABNORMAL HIGH (ref 0.00–0.07)
Basophils Absolute: 0 10*3/uL (ref 0.0–0.1)
Basophils Relative: 0 %
Eosinophils Absolute: 0.2 10*3/uL (ref 0.0–0.5)
Eosinophils Relative: 2 %
HCT: 41.3 % (ref 39.0–52.0)
Hemoglobin: 14.1 g/dL (ref 13.0–17.0)
Immature Granulocytes: 2 %
Lymphocytes Relative: 31 %
Lymphs Abs: 3.3 10*3/uL (ref 0.7–4.0)
MCH: 29 pg (ref 26.0–34.0)
MCHC: 34.1 g/dL (ref 30.0–36.0)
MCV: 85 fL (ref 80.0–100.0)
Monocytes Absolute: 0.9 10*3/uL (ref 0.1–1.0)
Monocytes Relative: 9 %
Neutro Abs: 6.2 10*3/uL (ref 1.7–7.7)
Neutrophils Relative %: 56 %
Platelets: 304 10*3/uL (ref 150–400)
RBC: 4.86 MIL/uL (ref 4.22–5.81)
RDW: 12.7 % (ref 11.5–15.5)
WBC: 10.9 10*3/uL — ABNORMAL HIGH (ref 4.0–10.5)
nRBC: 0 % (ref 0.0–0.2)

## 2020-04-22 LAB — COMPREHENSIVE METABOLIC PANEL
ALT: 73 U/L — ABNORMAL HIGH (ref 0–44)
AST: 41 U/L (ref 15–41)
Albumin: 2.8 g/dL — ABNORMAL LOW (ref 3.5–5.0)
Alkaline Phosphatase: 41 U/L (ref 38–126)
Anion gap: 10 (ref 5–15)
BUN: 30 mg/dL — ABNORMAL HIGH (ref 6–20)
CO2: 26 mmol/L (ref 22–32)
Calcium: 8.6 mg/dL — ABNORMAL LOW (ref 8.9–10.3)
Chloride: 102 mmol/L (ref 98–111)
Creatinine, Ser: 0.71 mg/dL (ref 0.61–1.24)
GFR, Estimated: >60 mL/min (ref >60)
Glucose, Bld: 108 mg/dL — ABNORMAL HIGH (ref 70–99)
Potassium: 4.1 mmol/L (ref 3.5–5.1)
Sodium: 138 mmol/L (ref 135–145)
Total Bilirubin: 0.7 mg/dL (ref 0.3–1.2)
Total Protein: 5.7 g/dL — ABNORMAL LOW (ref 6.5–8.1)

## 2020-04-22 LAB — PROCALCITONIN: Procalcitonin: <0.1 ng/mL

## 2020-04-22 LAB — D-DIMER, QUANTITATIVE: D-Dimer, Quant: 0.55 ug/mL-FEU — ABNORMAL HIGH (ref 0.00–0.50)

## 2020-04-22 LAB — C-REACTIVE PROTEIN: CRP: 0.9 mg/dL (ref <1.0)

## 2020-04-22 MED ORDER — ALBUTEROL SULFATE HFA 108 (90 BASE) MCG/ACT IN AERS: 2.0000 | INHALATION_SPRAY | Freq: Four times a day (QID) | RESPIRATORY_TRACT | 0 refills | Status: AC | PRN

## 2020-04-22 MED ORDER — AMLODIPINE BESYLATE 10 MG PO TABS: 10.0000 mg | ORAL_TABLET | Freq: Every day | ORAL | 0 refills | Status: AC

## 2020-04-22 MED ORDER — CARVEDILOL 6.25 MG PO TABS: 6.2500 mg | ORAL_TABLET | Freq: Two times a day (BID) | ORAL | 0 refills | Status: AC

## 2020-04-22 MED ORDER — ASPIRIN EC 81 MG PO TBEC: 81.0000 mg | DELAYED_RELEASE_TABLET | Freq: Every day | ORAL | 0 refills | Status: AC

## 2020-04-22 NOTE — Progress Notes (Signed)
Occupational Therapy Treatment Patient Details Name: Erik Guerra MRN: [id number] DOB: 1960-10-15 Today's Date: 04/22/2020    History of present illness 59 yo male found to be covid positive after attending the fair. PMH asthma   OT comments  Pt progressing towards established OT goals. Pt performing functional mobility in hallway with Supervision and maintaining SpO2 in 90s on RA. Pt demonstrating good purse lip breathing techniques for activity. Providing education on use of 3N1 as shower seat in tub; pt verbalized understanding. Continue to recommend dc to home once medically stable per physician. Answered all questions in preparation for possible dc.    Follow Up Recommendations  No OT follow up    Equipment Recommendations  3 in 1 bedside commode (As shower seat)    Recommendations for Other Services PT consult    Precautions / Restrictions Precautions Precautions: Fall;Other (comment) Precaution Comments: covid +, very anxious       Mobility Bed Mobility               General bed mobility comments: In recliner upon arrival  Transfers Overall transfer level: Needs assistance Equipment used: None Transfers: Sit to/from [employer] Sit to Stand: Supervision         General transfer comment: Supervision for safety    Balance Overall balance assessment: Mild deficits observed, not formally tested                                         ADL either performed or assessed with clinical judgement   ADL Overall ADL's : Needs assistance/impaired                                   Tub/Shower Transfer Details (indicate cue type and reason): Educating pt on use of 3N1 as shower seat in tub. Educating pt on safe transfer.  Functional mobility during ADLs: Supervision/safety General ADL Comments: Pt continues to perform ADLs and mobility at Supervision level. Pt performing mobility in hallway and maintained SpO2 in  90s on RA. Providing education on tub transfer with 3n1.      Vision       Perception     Praxis      Cognition Arousal/Alertness: Awake/alert Behavior During Therapy: WFL for tasks assessed/performed;Anxious Overall Cognitive Status: Within Functional Limits for tasks assessed                                          Exercises     Shoulder Instructions       General Comments SpO2 90s on RA. HR 70-90s. RR 20s    Pertinent Vitals/ Pain       Pain Assessment: Faces Faces Pain Scale: Hurts a little bit Pain Location: generalized discomfort Pain Descriptors / Indicators: Heaviness;Headache Pain Intervention(s): Monitored during session;Limited activity within patient's tolerance;Repositioned  Home Living                                          Prior Functioning/Environment              Frequency  Min 2X/week  Progress Toward Goals  OT Goals(current goals can now be found in the care plan section)  Progress towards OT goals: Progressing toward goals  Acute Rehab OT Goals Patient Stated Goal: Get home OT Goal Formulation: With patient Time For Goal Achievement: 05/05/20 Potential to Achieve Goals: Good ADL Goals Pt Will Perform Grooming: with modified independence;standing Pt Will Perform Tub/Shower Transfer: Tub transfer;with supervision;3 in 1;ambulating Additional ADL Goal #1: Pt will independently verbalize three energy conservation techniques for ADLs and IADLs Additional ADL Goal #2: Pt will independently monitor SpO2 and use purse lip breathing for ADLs  Plan Discharge plan remains appropriate    Co-evaluation                 AM-PAC OT "6 Clicks" Daily Activity     Outcome Measure   Help from another person eating meals?: None Help from another person taking care of personal grooming?: None Help from another person toileting, which includes using toliet, bedpan, or urinal?: None Help from  another person bathing (including washing, rinsing, drying)?: None Help from another person to put on and taking off regular upper body clothing?: None Help from another person to put on and taking off regular lower body clothing?: None 6 Click Score: 24    End of Session    OT Visit Diagnosis: Unsteadiness on feet (R26.81);Other abnormalities of gait and mobility (R26.89);Muscle weakness (generalized) (M62.81)   Activity Tolerance Patient tolerated treatment well   Patient Left in chair;with call bell/phone within reach   Nurse Communication Mobility status        Time: 8416-6063 OT Time Calculation (min): 14 min  Charges: OT General Charges $OT Visit: 1 Visit OT Treatments $Therapeutic Activity: 8-22 mins  Erik Guerra MSOT, OTR/L Acute Rehab Pager: [phone] Office: [phone]   Erik Guerra 04/22/2020, 10:45 AM

## 2020-04-22 NOTE — Progress Notes (Signed)
SATURATION QUALIFICATIONS: (This note is used to comply with regulatory documentation for home oxygen)  Patient Saturations on Room Air at Rest = 97-95%  Patient Saturations on Room Air while Ambulating = 94-91%  Please briefly explain why patient needs home oxygen: Pt performing BADLs and functional mobility on RA with increased time and purse lip breathing.

## 2020-04-22 NOTE — Discharge Instructions (Signed)
Follow with Primary MD in 7 days   Get CBC, CMP, 2 view Chest X ray -  checked next visit within 1 week by Primary MD   Activity: As tolerated with Full fall precautions use walker/cane & assistance as needed  Disposition Home    Diet: Heart Healthy   Special Instructions: If you have smoked or chewed Tobacco  in the last 2 yrs please stop smoking, stop any regular Alcohol  and or any Recreational drug use.  On your next visit with your primary care physician please Get Medicines reviewed and adjusted.  Please request your Prim.MD to go over all Hospital Tests and Procedure/Radiological results at the follow up, please get all Hospital records sent to your Prim MD by signing hospital release before you go home.  If you experience worsening of your admission symptoms, develop shortness of breath, life threatening emergency, suicidal or homicidal thoughts you must seek medical attention immediately by calling 911 or calling your MD immediately  if symptoms less severe.  You Must read complete instructions/literature along with all the possible adverse reactions/side effects for all the Medicines you take and that have been prescribed to you. Take any new Medicines after you have completely understood and accpet all the possible adverse reactions/side effects.        Person Under Monitoring Name: Erik Guerra  Location: [address] [place] [address]   Infection Prevention Recommendations for Individuals Confirmed to have, or Being Evaluated for, 2019 Novel Coronavirus (COVID-19) Infection Who Receive Care at Home  Individuals who are confirmed to have, or are being evaluated for, COVID-19 should follow the prevention steps below until a healthcare provider or local or state health department says they can return to normal activities.  Stay home except to get medical care You should restrict activities outside your home, except for getting medical care. Do not go to work,  school, or public areas, and do not use public transportation or taxis.  Call ahead before visiting your doctor Before your medical appointment, call the healthcare provider and tell them that you have, or are being evaluated for, COVID-19 infection. This will help the healthcare provider's office take steps to keep other people from getting infected. Ask your healthcare provider to call the local or state health department.  Monitor your symptoms Seek prompt medical attention if your illness is worsening (e.g., difficulty breathing). Before going to your medical appointment, call the healthcare provider and tell them that you have, or are being evaluated for, COVID-19 infection. Ask your healthcare provider to call the local or state health department.  Wear a facemask You should wear a facemask that covers your nose and mouth when you are in the same room with other people and when you visit a healthcare provider. People who live with or visit you should also wear a facemask while they are in the same room with you.  Separate yourself from other people in your home As much as possible, you should stay in a different room from other people in your home. Also, you should use a separate bathroom, if available.  Avoid sharing household items You should not share dishes, drinking glasses, cups, eating utensils, towels, bedding, or other items with other people in your home. After using these items, you should wash them thoroughly with soap and water.  Cover your coughs and sneezes Cover your mouth and nose with a tissue when you cough or sneeze, or you can cough or sneeze into your sleeve. Throw  used tissues in a lined trash can, and immediately wash your hands with soap and water for at least 20 seconds or use an alcohol-based hand rub.  Wash your [employer] your hands often and thoroughly with soap and water for at least 20 seconds. You can use an alcohol-based hand sanitizer if soap  and water are not available and if your hands are not visibly dirty. Avoid touching your eyes, nose, and mouth with unwashed hands.   Prevention Steps for Caregivers and Household Members of Individuals Confirmed to have, or Being Evaluated for, COVID-19 Infection Being Cared for in the Home  If you live with, or provide care at home for, a person confirmed to have, or being evaluated for, COVID-19 infection please follow these guidelines to prevent infection:  Follow healthcare provider's instructions Make sure that you understand and can help the patient follow any healthcare provider instructions for all care.  Provide for the patient's basic needs You should help the patient with basic needs in the home and provide support for getting groceries, prescriptions, and other personal needs.  Monitor the patient's symptoms If they are getting sicker, call his or her medical provider and tell them that the patient has, or is being evaluated for, COVID-19 infection. This will help the healthcare provider's office take steps to keep other people from getting infected. Ask the healthcare provider to call the local or state health department.  Limit the number of people who have contact with the patient  If possible, have only one caregiver for the patient.  Other household members should stay in another home or place of residence. If this is not possible, they should stay  in another room, or be separated from the patient as much as possible. Use a separate bathroom, if available.  Restrict visitors who do not have an essential need to be in the home.  Keep older adults, very young children, and other sick people away from the patient Keep older adults, very young children, and those who have compromised immune systems or chronic health conditions away from the patient. This includes people with chronic heart, lung, or kidney conditions, diabetes, and cancer.  Ensure good  ventilation Make sure that shared spaces in the home have good air flow, such as from an air conditioner or an opened window, weather permitting.  Wash your hands often  Wash your hands often and thoroughly with soap and water for at least 20 seconds. You can use an alcohol based hand sanitizer if soap and water are not available and if your hands are not visibly dirty.  Avoid touching your eyes, nose, and mouth with unwashed hands.  Use disposable paper towels to dry your hands. If not available, use dedicated cloth towels and replace them when they become wet.  Wear a facemask and gloves  Wear a disposable facemask at all times in the room and gloves when you touch or have contact with the patient's blood, body fluids, and/or secretions or excretions, such as sweat, saliva, sputum, nasal mucus, vomit, urine, or feces.  Ensure the mask fits over your nose and mouth tightly, and do not touch it during use.  Throw out disposable facemasks and gloves after using them. Do not reuse.  Wash your hands immediately after removing your facemask and gloves.  If your personal clothing becomes contaminated, carefully remove clothing and launder. Wash your hands after handling contaminated clothing.  Place all used disposable facemasks, gloves, and other waste in a lined container before  disposing them with other household waste.  Remove gloves and wash your hands immediately after handling these items.  Do not share dishes, glasses, or other household items with the patient  Avoid sharing household items. You should not share dishes, drinking glasses, cups, eating utensils, towels, bedding, or other items with a patient who is confirmed to have, or being evaluated for, COVID-19 infection.  After the person uses these items, you should wash them thoroughly with soap and water.  Wash laundry thoroughly  Immediately remove and wash clothes or bedding that have blood, body fluids, and/or  secretions or excretions, such as sweat, saliva, sputum, nasal mucus, vomit, urine, or feces, on them.  Wear gloves when handling laundry from the patient.  Read and follow directions on labels of laundry or clothing items and detergent. In general, wash and dry with the warmest temperatures recommended on the label.  Clean all areas the individual has used often  Clean all touchable surfaces, such as counters, tabletops, doorknobs, bathroom fixtures, toilets, phones, keyboards, tablets, and bedside tables, every day. Also, clean any surfaces that may have blood, body fluids, and/or secretions or excretions on them.  Wear gloves when cleaning surfaces the patient has come in contact with.  Use a diluted bleach solution (e.g., dilute bleach with 1 part bleach and 10 parts water) or a household disinfectant with a label that says EPA-registered for coronaviruses. To make a bleach solution at home, add 1 tablespoon of bleach to 1 quart (4 cups) of water. For a larger supply, add  cup of bleach to 1 gallon (16 cups) of water.  Read labels of cleaning products and follow recommendations provided on product labels. Labels contain instructions for safe and effective use of the cleaning product including precautions you should take when applying the product, such as wearing gloves or eye protection and making sure you have good ventilation during use of the product.  Remove gloves and wash hands immediately after cleaning.  Monitor yourself for signs and symptoms of illness Caregivers and household members are considered close contacts, should monitor their health, and will be asked to limit movement outside of the home to the extent possible. Follow the monitoring steps for close contacts listed on the symptom monitoring form.   ? If you have additional questions, contact your local health department or call the epidemiologist on call at [phone] (available 24/7). ? This guidance is subject  to change. For the most up-to-date guidance from [facility], please refer to their website: [email or web address]

## 2020-04-22 NOTE — Progress Notes (Signed)
Pt discharged at 1515 via wheelchair. Sent with all patient belongings. Pt's wife, [name], here to transport Pt home.
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
## 2020-12-30 DIAGNOSIS — I361 Nonrheumatic tricuspid (valve) insufficiency: Secondary | ICD-10-CM

## 2022-06-24 IMAGING — DX DG CHEST 1V PORT
1 series · 1 of 1 positions shown · non-contrast
Comparison: April 19, 2020

CLINICAL DATA: 8BXAN-5U positive.  Follow-up.

EXAM:
PORTABLE CHEST 1 VIEW

[chest ap]
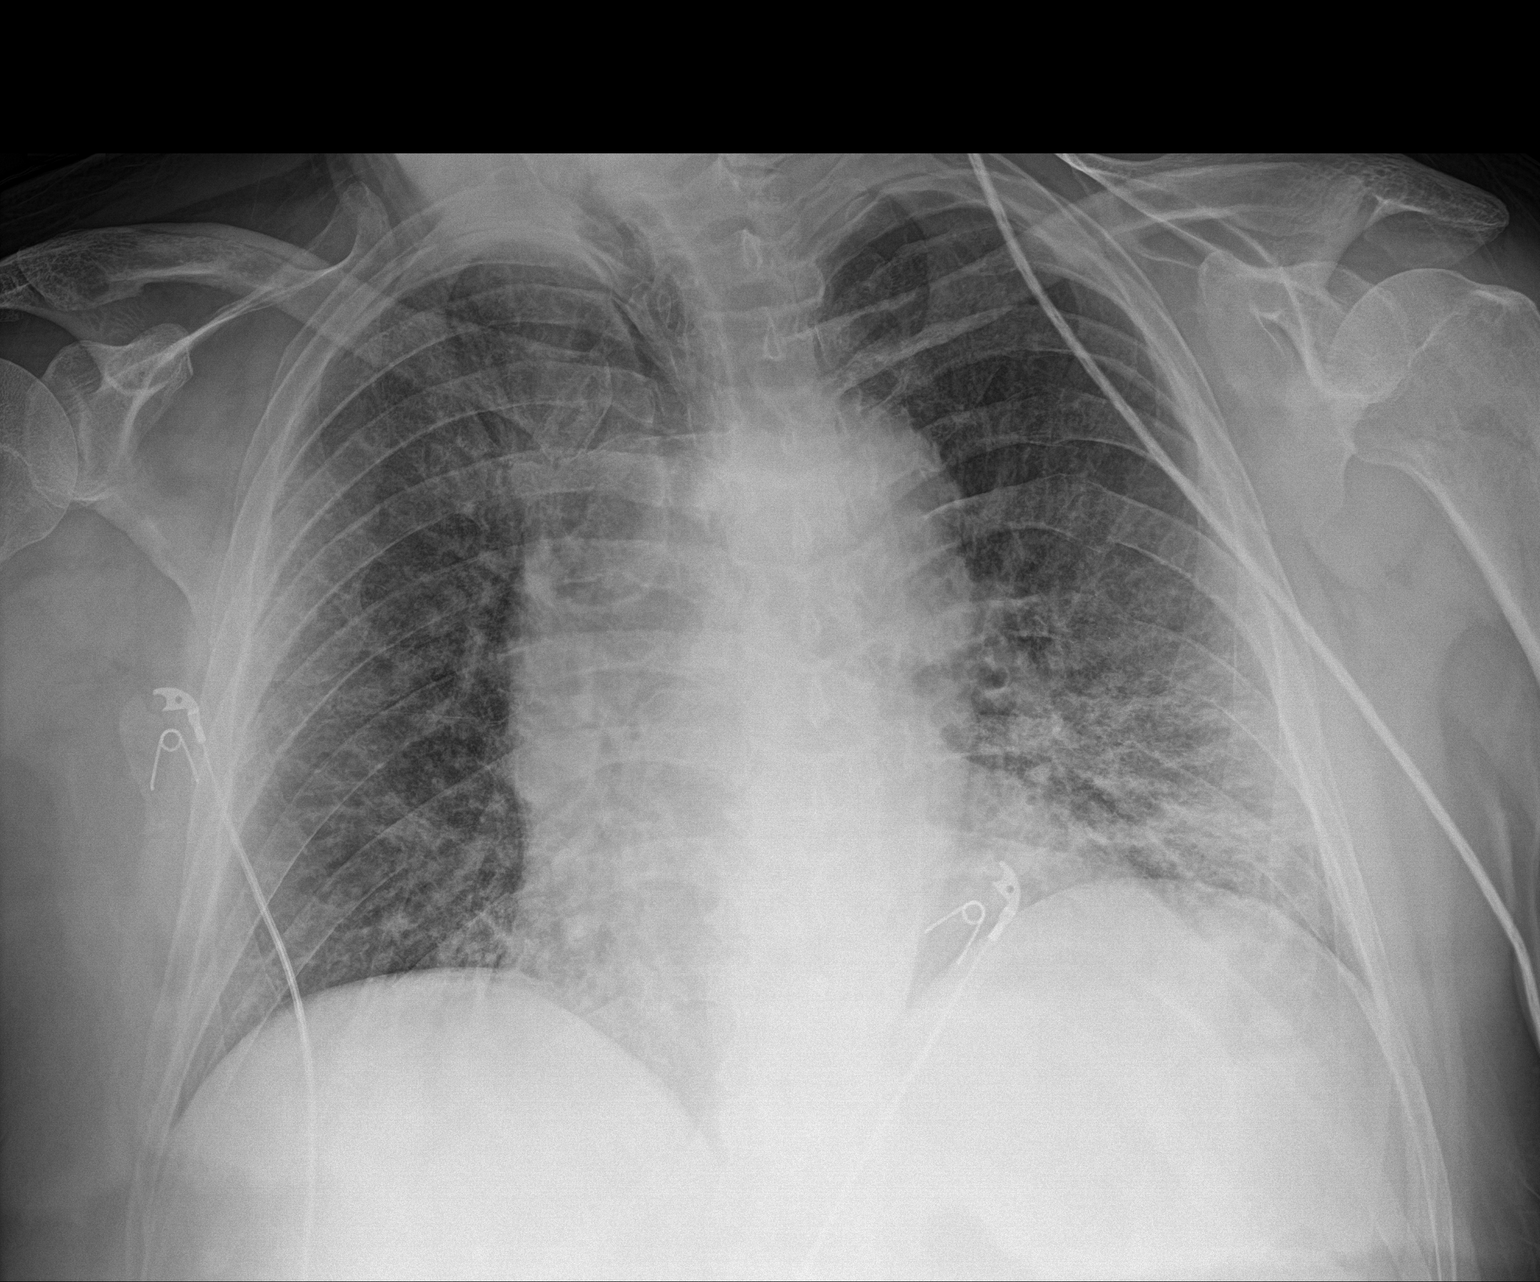

[1 of 1 positions shown; findings below may reference images not displayed]

FINDINGS: The patient has bilateral multifocal pneumonia remains but is
improved in the interval. Stable cardiomediastinal silhouette. No
pneumothorax. No other changes.
IMPRESSION: Improving bilateral multifocal pneumonia.
